# Patient Record
Sex: Male | Born: 1965 | Race: Black or African American | Hispanic: No | Marital: Married | Smoking: Former smoker
Health system: Southern US, Community
[De-identification: ages and names within clinical notes are randomized; demographics above are authoritative.]

## PROBLEM LIST (undated history)

## (undated) DIAGNOSIS — Z95820 Peripheral vascular angioplasty status with implants and grafts: Secondary | ICD-10-CM

## (undated) DIAGNOSIS — I219 Acute myocardial infarction, unspecified: Secondary | ICD-10-CM

## (undated) DIAGNOSIS — I469 Cardiac arrest, cause unspecified: Secondary | ICD-10-CM

## (undated) DIAGNOSIS — K219 Gastro-esophageal reflux disease without esophagitis: Secondary | ICD-10-CM

## (undated) DIAGNOSIS — T7840XA Allergy, unspecified, initial encounter: Secondary | ICD-10-CM

## (undated) DIAGNOSIS — E785 Hyperlipidemia, unspecified: Secondary | ICD-10-CM

## (undated) DIAGNOSIS — I739 Peripheral vascular disease, unspecified: Secondary | ICD-10-CM

## (undated) DIAGNOSIS — G4730 Sleep apnea, unspecified: Secondary | ICD-10-CM

## (undated) DIAGNOSIS — I10 Essential (primary) hypertension: Secondary | ICD-10-CM

## (undated) DIAGNOSIS — R011 Cardiac murmur, unspecified: Secondary | ICD-10-CM

## (undated) DIAGNOSIS — I509 Heart failure, unspecified: Secondary | ICD-10-CM

## (undated) DIAGNOSIS — I2510 Atherosclerotic heart disease of native coronary artery without angina pectoris: Secondary | ICD-10-CM

## (undated) HISTORY — DX: Sleep apnea, unspecified: G47.30

## (undated) HISTORY — PX: FRACTURE SURGERY: SHX138

## (undated) HISTORY — DX: Acute myocardial infarction, unspecified: I21.9

## (undated) HISTORY — PX: COLONOSCOPY: SHX174

## (undated) HISTORY — DX: Hyperlipidemia, unspecified: E78.5

## (undated) HISTORY — DX: Essential (primary) hypertension: I10

## (undated) HISTORY — PX: WISDOM TOOTH EXTRACTION: SHX21

## (undated) HISTORY — PX: CARDIAC SURGERY: SHX584

## (undated) HISTORY — DX: Heart failure, unspecified: I50.9

## (undated) HISTORY — DX: Cardiac murmur, unspecified: R01.1

## (undated) HISTORY — DX: Peripheral vascular disease, unspecified: I73.9

## (undated) HISTORY — PX: CORONARY STENT PLACEMENT: SHX1402

## (undated) HISTORY — DX: Allergy, unspecified, initial encounter: T78.40XA

## (undated) HISTORY — DX: Gastro-esophageal reflux disease without esophagitis: K21.9

## (undated) HISTORY — PX: POLYPECTOMY: SHX149

---

## 2012-04-11 ENCOUNTER — Encounter: Payer: Self-pay | Admitting: Gastroenterology

## 2012-12-04 DIAGNOSIS — I219 Acute myocardial infarction, unspecified: Secondary | ICD-10-CM

## 2012-12-04 HISTORY — DX: Acute myocardial infarction, unspecified: I21.9

## 2013-03-29 ENCOUNTER — Emergency Department (HOSPITAL_COMMUNITY): Payer: BC Managed Care – PPO

## 2013-03-29 ENCOUNTER — Inpatient Hospital Stay (HOSPITAL_COMMUNITY)
Admission: EM | Admit: 2013-03-29 | Discharge: 2013-04-02 | DRG: 550 | Disposition: A | Discharge status: Home / Self Care | Payer: BC Managed Care – PPO | Attending: Cardiovascular Disease | Admitting: Cardiovascular Disease

## 2013-03-29 ENCOUNTER — Ambulatory Visit (HOSPITAL_COMMUNITY): Admit: 2013-03-29 | Payer: Self-pay | Admitting: Cardiovascular Disease

## 2013-03-29 ENCOUNTER — Encounter (HOSPITAL_COMMUNITY)
Admission: EM | Disposition: A | Discharge status: Home / Self Care | Payer: Self-pay | Source: Home / Self Care | Attending: Cardiovascular Disease

## 2013-03-29 DIAGNOSIS — Z95820 Peripheral vascular angioplasty status with implants and grafts: Secondary | ICD-10-CM

## 2013-03-29 DIAGNOSIS — R079 Chest pain, unspecified: Secondary | ICD-10-CM

## 2013-03-29 DIAGNOSIS — I472 Ventricular tachycardia, unspecified: Secondary | ICD-10-CM | POA: Diagnosis present

## 2013-03-29 DIAGNOSIS — Z955 Presence of coronary angioplasty implant and graft: Secondary | ICD-10-CM

## 2013-03-29 DIAGNOSIS — Z87891 Personal history of nicotine dependence: Secondary | ICD-10-CM

## 2013-03-29 DIAGNOSIS — R404 Transient alteration of awareness: Secondary | ICD-10-CM | POA: Diagnosis present

## 2013-03-29 DIAGNOSIS — I2582 Chronic total occlusion of coronary artery: Secondary | ICD-10-CM | POA: Diagnosis present

## 2013-03-29 DIAGNOSIS — I213 ST elevation (STEMI) myocardial infarction of unspecified site: Secondary | ICD-10-CM

## 2013-03-29 DIAGNOSIS — I2510 Atherosclerotic heart disease of native coronary artery without angina pectoris: Secondary | ICD-10-CM | POA: Diagnosis present

## 2013-03-29 DIAGNOSIS — I4949 Other premature depolarization: Secondary | ICD-10-CM | POA: Diagnosis present

## 2013-03-29 DIAGNOSIS — I209 Angina pectoris, unspecified: Secondary | ICD-10-CM | POA: Diagnosis present

## 2013-03-29 DIAGNOSIS — I2119 ST elevation (STEMI) myocardial infarction involving other coronary artery of inferior wall: Principal | ICD-10-CM | POA: Diagnosis present

## 2013-03-29 DIAGNOSIS — E876 Hypokalemia: Secondary | ICD-10-CM | POA: Diagnosis present

## 2013-03-29 DIAGNOSIS — I4729 Other ventricular tachycardia: Secondary | ICD-10-CM | POA: Diagnosis present

## 2013-03-29 DIAGNOSIS — I469 Cardiac arrest, cause unspecified: Secondary | ICD-10-CM

## 2013-03-29 HISTORY — PX: LEFT HEART CATHETERIZATION WITH CORONARY ANGIOGRAM: SHX5451

## 2013-03-29 HISTORY — DX: Atherosclerotic heart disease of native coronary artery without angina pectoris: I25.10

## 2013-03-29 HISTORY — DX: Cardiac arrest, cause unspecified: I46.9

## 2013-03-29 HISTORY — DX: Peripheral vascular angioplasty status with implants and grafts: Z95.820

## 2013-03-29 LAB — CBC
Hemoglobin: 16.3 g/dL (ref 13.0–17.0)
Platelets: 156 10*3/uL (ref 150–400)
RBC: 5.24 MIL/uL (ref 4.22–5.81)
WBC: 16.4 10*3/uL — ABNORMAL HIGH (ref 4.0–10.5)

## 2013-03-29 LAB — POCT I-STAT, CHEM 8
BUN: 14 mg/dL (ref 6–23)
Calcium, Ion: 1.09 mmol/L — ABNORMAL LOW (ref 1.12–1.23)
Creatinine, Ser: 1.2 mg/dL (ref 0.50–1.35)
Glucose, Bld: 149 mg/dL — ABNORMAL HIGH (ref 70–99)
Sodium: 140 mEq/L (ref 135–145)
TCO2: 21 mmol/L (ref 0–100)

## 2013-03-29 LAB — COMPREHENSIVE METABOLIC PANEL
ALT: 46 U/L (ref 0–53)
AST: 48 U/L — ABNORMAL HIGH (ref 0–37)
Alkaline Phosphatase: 50 U/L (ref 39–117)
CO2: 19 mEq/L (ref 19–32)
Chloride: 102 mEq/L (ref 96–112)
GFR calc Af Amer: 86 mL/min — ABNORMAL LOW (ref >90)
GFR calc non Af Amer: 75 mL/min — ABNORMAL LOW (ref >90)
Glucose, Bld: 149 mg/dL — ABNORMAL HIGH (ref 70–99)
Sodium: 140 mEq/L (ref 135–145)
Total Bilirubin: 0.4 mg/dL (ref 0.3–1.2)

## 2013-03-29 SURGERY — LEFT HEART CATHETERIZATION WITH CORONARY ANGIOGRAM
Anesthesia: LOCAL

## 2013-03-29 MED ORDER — BIVALIRUDIN 250 MG IV SOLR
INTRAVENOUS | Status: AC
  Filled 2013-03-29: qty 250

## 2013-03-29 MED ORDER — POTASSIUM CHLORIDE 10 MEQ/100ML IV SOLN
INTRAVENOUS | Status: AC
  Administered 2013-03-29: 10 meq via INTRAVENOUS
  Filled 2013-03-29: qty 400

## 2013-03-29 MED ORDER — SODIUM CHLORIDE 0.9 % IV SOLN
INTRAVENOUS | Status: DC
  Administered 2013-03-30 (×2): via INTRAVENOUS

## 2013-03-29 MED ORDER — ASPIRIN EC 81 MG PO TBEC
81.0000 mg | DELAYED_RELEASE_TABLET | Freq: Every day | ORAL | Status: DC
  Administered 2013-03-30 – 2013-04-02 (×4): 81 mg via ORAL
  Filled 2013-03-29 (×4): qty 1

## 2013-03-29 MED ORDER — ASPIRIN 81 MG PO CHEW
324.0000 mg | CHEWABLE_TABLET | Freq: Once | ORAL | Status: AC
  Administered 2013-03-29: 324 mg via ORAL

## 2013-03-29 MED ORDER — ATORVASTATIN CALCIUM 80 MG PO TABS
80.0000 mg | ORAL_TABLET | Freq: Every day | ORAL | Status: DC
  Administered 2013-03-30 – 2013-04-01 (×3): 80 mg via ORAL
  Filled 2013-03-29 (×4): qty 1

## 2013-03-29 MED ORDER — TICAGRELOR 90 MG PO TABS
ORAL_TABLET | ORAL | Status: AC
  Filled 2013-03-29: qty 2

## 2013-03-29 MED ORDER — ONDANSETRON HCL 4 MG/2ML IJ SOLN
4.0000 mg | Freq: Four times a day (QID) | INTRAMUSCULAR | Status: DC | PRN
  Administered 2013-03-30 (×2): 4 mg via INTRAVENOUS
  Filled 2013-03-29 (×2): qty 2

## 2013-03-29 MED ORDER — FENTANYL CITRATE 0.05 MG/ML IJ SOLN
INTRAMUSCULAR | Status: AC
  Filled 2013-03-29: qty 2

## 2013-03-29 MED ORDER — HEPARIN BOLUS VIA INFUSION: 4000.0000 [IU] | Freq: Once | INTRAVENOUS | Status: DC

## 2013-03-29 MED ORDER — NITROGLYCERIN IN D5W 200-5 MCG/ML-% IV SOLN
INTRAVENOUS | Status: AC
  Administered 2013-03-30: 10 ug/min via INTRAVENOUS
  Filled 2013-03-29: qty 250

## 2013-03-29 MED ORDER — NITROGLYCERIN IN D5W 200-5 MCG/ML-% IV SOLN: 2.0000 ug/min | INTRAVENOUS | Status: DC

## 2013-03-29 MED ORDER — HEPARIN SODIUM (PORCINE) 5000 UNIT/ML IJ SOLN
5000.0000 [IU] | Freq: Once | INTRAMUSCULAR | Status: AC
  Administered 2013-03-29: 5000 [IU] via INTRAVENOUS

## 2013-03-29 MED ORDER — ACETAMINOPHEN 325 MG PO TABS
650.0000 mg | ORAL_TABLET | ORAL | Status: DC | PRN
  Administered 2013-03-30: 650 mg via ORAL
  Filled 2013-03-29: qty 2

## 2013-03-29 MED ORDER — HEPARIN (PORCINE) IN NACL 2-0.9 UNIT/ML-% IJ SOLN
INTRAMUSCULAR | Status: AC
  Filled 2013-03-29: qty 1000

## 2013-03-29 MED ORDER — HEPARIN (PORCINE) IN NACL 100-0.45 UNIT/ML-% IJ SOLN
1250.0000 [IU]/h | INTRAMUSCULAR | Status: DC
  Administered 2013-03-29: 1250 [IU]/h via INTRAVENOUS
  Filled 2013-03-29: qty 250

## 2013-03-29 MED ORDER — POTASSIUM CHLORIDE 10 MEQ/100ML IV SOLN
10.0000 meq | INTRAVENOUS | Status: AC
  Administered 2013-03-30 (×3): 10 meq via INTRAVENOUS

## 2013-03-29 MED ORDER — AMIODARONE HCL IN DEXTROSE 360-4.14 MG/200ML-% IV SOLN
INTRAVENOUS | Status: AC
  Administered 2013-03-29: 200 mL via INTRAVENOUS
  Filled 2013-03-29: qty 200

## 2013-03-29 MED ORDER — LIDOCAINE HCL (PF) 1 % IJ SOLN
INTRAMUSCULAR | Status: AC
  Filled 2013-03-29: qty 30

## 2013-03-29 MED ORDER — AMIODARONE IV BOLUS ONLY 150 MG/100ML: 150.0000 mg | Freq: Once | INTRAVENOUS | Status: DC

## 2013-03-29 MED ORDER — MIDAZOLAM HCL 2 MG/2ML IJ SOLN
INTRAMUSCULAR | Status: AC
  Filled 2013-03-29: qty 2

## 2013-03-29 MED ORDER — TICAGRELOR 90 MG PO TABS
90.0000 mg | ORAL_TABLET | Freq: Two times a day (BID) | ORAL | Status: DC
  Administered 2013-03-30 – 2013-04-02 (×7): 90 mg via ORAL
  Filled 2013-03-29 (×8): qty 1

## 2013-03-29 MED ORDER — HEPARIN (PORCINE) IN NACL 100-0.45 UNIT/ML-% IJ SOLN: 12.0000 [IU]/kg/h | Freq: Once | INTRAMUSCULAR | Status: DC

## 2013-03-29 MED ORDER — NITROGLYCERIN 1 MG/10 ML FOR IR/CATH LAB
INTRA_ARTERIAL | Status: AC
  Filled 2013-03-29: qty 10

## 2013-03-29 MED ORDER — SODIUM CHLORIDE 0.9 % IV BOLUS (SEPSIS)
500.0000 mL | Freq: Once | INTRAVENOUS | Status: AC
  Administered 2013-03-29: 500 mL via INTRAVENOUS

## 2013-03-29 NOTE — ED Notes (Signed)
Patient undressed, patient on Zoll pads for cath lab.

## 2013-03-29 NOTE — H&P (Signed)
HPI: The patient is a 47 y/o AAM with no significant past medical history and no known cardiac risk factors, who presented to [facility] with an inferior STEMI. The patient reportedly developed substernal chest pain earlier this evening that lasted 1.5-2 hrs before calling EMS. When EMS arrived to his home, an EKG was obtained that showed ST elevations in the inferior leads. Code STEMI was activated. Enroute, the patient went into Torsades and required a total of 4 shocks for resuscitation. Post resuscitation, he was bradycardic. Temporary pacer pads were placed. The patient is now alert and responsive. He continues to endorse chest pain that has now radiated to the right side. The pain is currently 7/10. He denies any other symptoms.   No past medical history on file.  No past surgical history on file.  No family history on file. Social History:  has no tobacco, alcohol, and drug history on file.  Allergies: No Known Allergies   (Not in a hospital admission)  Results for orders placed during the hospital encounter of 03/29/13 (from the past 48 hour(s))  POCT I-STAT TROPONIN I     Status: None   Collection Time    03/29/13  9:28 PM      Result Value Range   Troponin i, poc 0.02  0.00 - 0.08 ng/mL   Comment 3            Comment: Due to the release kinetics of cTnI,     a negative result within the first hours     of the onset of symptoms does not rule out     myocardial infarction with certainty.     If myocardial infarction is still suspected,     repeat the test at appropriate intervals.  POCT I-STAT, CHEM 8     Status: Abnormal   Collection Time    03/29/13  9:29 PM      Result Value Range   Sodium 140  135 - 145 mEq/L   Potassium 3.4 (*) 3.5 - 5.1 mEq/L   Chloride 106  96 - 112 mEq/L   BUN 14  6 - 23 mg/dL   Creatinine, Ser 4.78  0.50 - 1.35 mg/dL   Glucose, Bld 295 (*) 70 - 99 mg/dL   Calcium, Ion 6.21 (*) 1.12 - 1.23 mmol/L   TCO2 21  0 - 100 mmol/L   Hemoglobin 17.0  13.0 -  17.0 g/dL   HCT 30.8  65.7 - 84.6 %   No results found.  Review of Systems  Respiratory: Negative for shortness of breath.   Cardiovascular: Positive for chest pain.  Neurological: Positive for loss of consciousness.    Blood pressure 152/102, pulse 83, temperature 97.6 F (36.4 C), temperature source Oral, resp. rate 27, height 5\' 10"  (1.778 m), weight 220 lb (99.791 kg), SpO2 100.00%. Physical Exam  Constitutional: He is oriented to person, place, and time. He appears well-developed and well-nourished. No distress.  HENT:  Head: Atraumatic.  Eyes: Conjunctivae are normal. Pupils are equal, round, and reactive to light.  Cardiovascular: Normal rate and regular rhythm.  Exam reveals friction rub. Exam reveals no gallop.   No murmur heard. Pulses:      Radial pulses are 2+ on the right side, and 2+ on the left side.       Dorsalis pedis pulses are 1+ on the right side, and 1+ on the left side.  Respiratory: Effort normal and breath sounds normal. No respiratory distress. He has no wheezes. He  has no rales.  Musculoskeletal: He exhibits no edema.  Neurological: He is alert and oriented to person, place, and time.  Skin: Skin is warm and dry. He is not diaphoretic.  Psychiatric: He has a normal mood and affect. His behavior is normal.     Assessment/Plan Principal Problem:   STEMI (ST elevation myocardial infarction)  Plan: Code STEMI activated. Pt taken urgently to cath lab for PCI w/ Dr. Tresa Endo.   Allayne Butcher, PA-C 03/29/2013, 9:46 PM   Patient seen and examined. Agree with assessment and plan. 47 yo AAm without prior known cardiac history who presents to ER with inferior STEMI. Of note prior to arrival, pt developed probable ischemia mediated torsades du pointes requiring countershock. ECG suggest inferolateral ST elevation with precordial ST depression suggestive of probable circumflex/RCA occlusion. Plan emergent cath.   Lennette Bihari, MD, [facility] 03/29/2013 11:29  PM

## 2013-03-29 NOTE — Progress Notes (Signed)
ANTICOAGULATION CONSULT NOTE - Initial Consult  Pharmacy Consult for Heparin Indication: STEMI  No Known Allergies  Patient Measurements:   Per patient - Height 69", Weight 220 lb. Heparin Dosing Weight: 92 kg  Vital Signs: Temp: 97.6 F (36.4 C) (04/26 2108) Temp src: Oral (04/26 2108) BP: 149/99 mmHg (04/26 2108) Pulse Rate: 85 (04/26 2108)  Labs: No results found for this basename: HGB, HCT, PLT, APTT, LABPROT, INR, HEPARINUNFRC, CREATININE, CKTOTAL, CKMB, TROPONINI,  in the last 72 hours  CrCl is unknown because no creatinine reading has been taken and the patient has no height on file.   Medical History: No past medical history on file.  Medications:  Infusions:  . [COMPLETED] amiodarone (NEXTERONE PREMIX) 360 mg/200 mL dextrose 200 mL (03/29/13 2115)  . [COMPLETED] sodium chloride 500 mL (03/29/13 2114)    Assessment: 47 year old male with STEMI and s/p Vfib arrest to receive anticoagulation with Heparin.  He has already received a 5000 units bolus in the ED.  Goal of Therapy:  Heparin level 0.3-0.7 units/ml Monitor platelets by anticoagulation protocol: Yes   Plan:  Start Heparin drip at 1250 units/hr Follow-up post-cath for anticoagulation needs  Estella Husk, Pharm.D., BCPS Clinical Pharmacist Phone: [phone] or [phone] Pager: [phone] 03/29/2013, 9:25 PM

## 2013-03-29 NOTE — ED Provider Notes (Signed)
History     CSN: [id number]  Arrival date & time 03/29/13  2100   First MD Initiated Contact with Patient 03/29/13 2113      Chief Complaint  Patient presents with  . Code STEMI    (Consider location/radiation/quality/duration/timing/severity/associated sxs/prior treatment) HPI Comments: 56 y M with no known PMH here with inferior/posterior STEMI.  Denies medication use, tobacco use and illicit drug use.  CP started approx 1.5 hours PTA.  Pt was cardioverted x4 by EMS for torsades.  He was also paced PTA for bradycardia and had 150 mg Amio running on arrival.  HDS, with moderate chest discomfort on arrival.   Patient is a 47 y.o. male presenting with chest pain. The history is provided by the patient and the EMS personnel.  Chest Pain Pain location:  Substernal area Pain quality: aching   Pain severity:  Moderate Onset quality:  Sudden Timing:  Constant Progression since onset: improved. Chronicity:  New Context: no drug use     History reviewed. No pertinent past medical history.  Past Surgical History  Procedure Laterality Date  . Fracture surgery      both ankles as a child    History reviewed. No pertinent family history.  History  Substance Use Topics  . Smoking status: Former Smoker -- 23 years    Types: Cigarettes    Quit date: 03/04/2012  . Smokeless tobacco: Not on file  . Alcohol Use: 3.0 oz/week    5 Cans of beer per week      Review of Systems  Unable to perform ROS: Acuity of condition  Cardiovascular: Positive for chest pain.    Allergies  Review of patient's allergies indicates no known allergies.  Home Medications  No current outpatient prescriptions on file.  BP 149/99  Pulse 85  Temp(Src) 97.6 F (36.4 C) (Oral)  Resp 25  SpO2 98%  Physical Exam  Vitals reviewed. Constitutional: He is oriented to person, place, and time. He appears well-developed and well-nourished. He appears distressed.  HENT:  Head: Normocephalic.  Right  Ear: External ear normal.  Left Ear: External ear normal.  Nose: Nose normal.  Mouth/Throat: Oropharynx is clear and moist. No oropharyngeal exudate.  Eyes: Conjunctivae and EOM are normal. Pupils are equal, round, and reactive to light.  Neck: Normal range of motion. Neck supple.  Cardiovascular: Normal rate, regular rhythm, normal heart sounds and intact distal pulses.  Exam reveals no gallop and no friction rub.   No murmur heard. Pulmonary/Chest: Effort normal.  BS decreased in bases bilaterally  Abdominal: Soft. Bowel sounds are normal. He exhibits no distension. There is no tenderness.  Musculoskeletal: Normal range of motion. He exhibits no edema and no tenderness.  Neurological: He is alert and oriented to person, place, and time.  Skin: Skin is warm and dry.  Psychiatric: He has a normal mood and affect.    ED Course  Procedures (including critical care time)  Labs Reviewed  CBC - Abnormal; Notable for the following:    WBC 16.4 (*)    All other components within normal limits  COMPREHENSIVE METABOLIC PANEL - Abnormal; Notable for the following:    Glucose, Bld 149 (*)    AST 48 (*)    GFR calc non Af Amer 75 (*)    GFR calc Af Amer 86 (*)    All other components within normal limits  POCT I-STAT, CHEM 8 - Abnormal; Notable for the following:    Potassium 3.4 (*)  Glucose, Bld 149 (*)    Calcium, Ion 1.09 (*)    All other components within normal limits  MRSA PCR SCREENING  PROTIME-INR  MAGNESIUM  CBC  BASIC METABOLIC PANEL  POCT I-STAT TROPONIN I   Dg Chest Port 1 View  03/29/2013  *RADIOLOGY REPORT*  Clinical Data: Code ST elevation myocardial infarction, cough  PORTABLE CHEST - 1 VIEW  Comparison: None.  Findings: Heart size is upper limits of normal.  Central vascular congestion noted with hazy perihilar airspace opacities but no focal opacity otherwise.  No pleural effusion. Fine detail is obscured by patient body habitus.  Mild right AC joint degenerative  change.  No acute osseous abnormality.  IMPRESSION: Mild cardiomegaly with central vascular congestion and hazy perihilar airspace opacities which could in part be due to overlying soft tissue structures, although early edema could have a similar appearance.   Original Report Authenticated By: Christiana Pellant, M.D.      1. STEMI (ST elevation myocardial infarction)   2. Chest pain       MDM   35 y M with no known PMH here with inferior/posterior STEMI.  Denies medication use, tobacco use and illicit drug use.  CP started approx 1.5 hours PTA.  Pt was cardioverted x4 by EMS for torsades.  He was also paced PTA for bradycardia and had 150 mg Amio running on arrival.  HDS, with moderate chest discomfort on arrival.  Intrinsic rate in 80s so pacing d/c'd.  Lungs decreased in the bases.  No edema.  EMS EKG c/w posterior/inferior STEMI with large STD in anterior leads.  5k heparin and 324 mg ASA given on arrival.  Amio and heparin gtt's ordered and NS bolus administered given inferior STEMI.  Nitro held given inferior/posterior involvement.  STEMI labs sent.  CXR obtained while waiting for cath lab.  Pt then taken immediately for catheterization.  Disposition: Cath Lab  Condition: Serious, Stable  Pt seen in conjunction with my attending, Dr. Blinda Leatherwood.  Oleh Genin, MD PGY-II [facility] Emergency Medicine Resident   Oleh Genin, MD 03/30/13 [phone]

## 2013-03-29 NOTE — ED Notes (Signed)
Patient with chest pain coming via EMS, went into VFib, shocked x4, was given 150mg  of Amiodorone, took advil prior to EMS arrival. Patient denies pain at this time, Code stemi Called upon arrival to Ed.

## 2013-03-29 NOTE — ED Notes (Signed)
To Cath lab 

## 2013-03-30 ENCOUNTER — Encounter (HOSPITAL_COMMUNITY): Payer: Self-pay | Admitting: *Deleted

## 2013-03-30 LAB — BASIC METABOLIC PANEL
BUN: 11 mg/dL (ref 6–23)
CO2: 22 mEq/L (ref 19–32)
Calcium: 8.2 mg/dL — ABNORMAL LOW (ref 8.4–10.5)
Creatinine, Ser: 0.89 mg/dL (ref 0.50–1.35)
Glucose, Bld: 133 mg/dL — ABNORMAL HIGH (ref 70–99)

## 2013-03-30 LAB — MAGNESIUM: Magnesium: 1.7 mg/dL (ref 1.5–2.5)

## 2013-03-30 LAB — CBC
Hemoglobin: 14.4 g/dL (ref 13.0–17.0)
MCH: 30.2 pg (ref 26.0–34.0)
MCV: 84.3 fL (ref 78.0–100.0)
RBC: 4.77 MIL/uL (ref 4.22–5.81)

## 2013-03-30 MED ORDER — AMIODARONE HCL IN DEXTROSE 360-4.14 MG/200ML-% IV SOLN
INTRAVENOUS | Status: AC
  Administered 2013-03-30: 200 mL via INTRAVENOUS
  Filled 2013-03-30: qty 200

## 2013-03-30 MED ORDER — AMIODARONE HCL IN DEXTROSE 360-4.14 MG/200ML-% IV SOLN: 60.0000 mg/h | INTRAVENOUS | Status: DC

## 2013-03-30 MED ORDER — PROMETHAZINE HCL 12.5 MG PO TABS
6.2600 mg | ORAL_TABLET | Freq: Four times a day (QID) | ORAL | Status: DC | PRN
Start: 2013-03-30 — End: 2013-04-01
  Administered 2013-03-30: 6.26 mg via ORAL
  Filled 2013-03-30 (×2): qty 1

## 2013-03-30 MED ORDER — LISINOPRIL 5 MG PO TABS
5.0000 mg | ORAL_TABLET | Freq: Every day | ORAL | Status: DC
  Administered 2013-03-30 – 2013-04-01 (×3): 5 mg via ORAL
  Filled 2013-03-30 (×3): qty 1

## 2013-03-30 MED ORDER — AMIODARONE HCL IN DEXTROSE 360-4.14 MG/200ML-% IV SOLN
60.0000 mg/h | INTRAVENOUS | Status: DC
  Administered 2013-03-30 – 2013-03-31 (×4): 60 mg/h via INTRAVENOUS
  Filled 2013-03-30 (×8): qty 200

## 2013-03-30 MED ORDER — AMIODARONE HCL IN DEXTROSE 360-4.14 MG/200ML-% IV SOLN
INTRAVENOUS | Status: AC
  Administered 2013-03-30: 60 mg/h via INTRAVENOUS
  Filled 2013-03-30: qty 200

## 2013-03-30 MED ORDER — AMIODARONE LOAD VIA INFUSION
150.0000 mg | Freq: Once | INTRAVENOUS | Status: AC
  Administered 2013-03-30: 150 mg via INTRAVENOUS

## 2013-03-30 MED ORDER — MAGNESIUM OXIDE 400 (241.3 MG) MG PO TABS
400.0000 mg | ORAL_TABLET | Freq: Two times a day (BID) | ORAL | Status: DC
  Administered 2013-03-30 – 2013-04-02 (×8): 400 mg via ORAL
  Filled 2013-03-30 (×9): qty 1

## 2013-03-30 MED ORDER — ONDANSETRON 8 MG/NS 50 ML IVPB
8.0000 mg | Freq: Four times a day (QID) | INTRAVENOUS | Status: DC | PRN
  Administered 2013-03-30: 8 mg via INTRAVENOUS
  Filled 2013-03-30 (×2): qty 8

## 2013-03-30 MED ORDER — SODIUM CHLORIDE 0.9 % IV SOLN
INTRAVENOUS | Status: DC | PRN
  Administered 2013-03-30 (×2): via INTRAVENOUS

## 2013-03-30 MED ORDER — MORPHINE SULFATE 2 MG/ML IJ SOLN
1.0000 mg | INTRAMUSCULAR | Status: DC | PRN
  Administered 2013-03-30: 1 mg via INTRAVENOUS
  Filled 2013-03-30: qty 1

## 2013-03-30 MED ORDER — METOPROLOL TARTRATE 25 MG PO TABS
25.0000 mg | ORAL_TABLET | Freq: Two times a day (BID) | ORAL | Status: DC
  Administered 2013-03-30: 25 mg via ORAL
  Filled 2013-03-30 (×3): qty 1

## 2013-03-30 MED ORDER — BIOTENE DRY MOUTH MT LIQD
15.0000 mL | Freq: Two times a day (BID) | OROMUCOSAL | Status: DC
  Administered 2013-03-30 – 2013-04-01 (×4): 15 mL via OROMUCOSAL

## 2013-03-30 MED ORDER — ZOLPIDEM TARTRATE 5 MG PO TABS: 5.0000 mg | ORAL_TABLET | Freq: Every evening | ORAL | Status: DC | PRN

## 2013-03-30 MED ORDER — ALPRAZOLAM 0.25 MG PO TABS
0.2500 mg | ORAL_TABLET | Freq: Three times a day (TID) | ORAL | Status: DC | PRN
  Administered 2013-03-30: 0.25 mg via ORAL
  Filled 2013-03-30: qty 1

## 2013-03-30 MED ORDER — METOPROLOL TARTRATE 25 MG PO TABS
25.0000 mg | ORAL_TABLET | Freq: Four times a day (QID) | ORAL | Status: DC
  Administered 2013-03-30 – 2013-03-31 (×5): 25 mg via ORAL
  Filled 2013-03-30 (×6): qty 1

## 2013-03-30 MED ORDER — ATROPINE SULFATE 1 MG/ML IJ SOLN
INTRAMUSCULAR | Status: AC
  Filled 2013-03-30: qty 1

## 2013-03-30 MED ORDER — SODIUM CHLORIDE 0.9 % IV SOLN
0.2500 mg/kg/h | INTRAVENOUS | Status: DC
  Filled 2013-03-30: qty 250

## 2013-03-30 MED ORDER — TRAMADOL HCL 50 MG PO TABS
50.0000 mg | ORAL_TABLET | Freq: Four times a day (QID) | ORAL | Status: DC | PRN
  Filled 2013-03-30: qty 1

## 2013-03-30 MED ORDER — AMIODARONE HCL IN DEXTROSE 360-4.14 MG/200ML-% IV SOLN
60.0000 mg/h | INTRAVENOUS | Status: AC
  Administered 2013-03-30 (×2): 60 mg/h via INTRAVENOUS
  Filled 2013-03-30: qty 200

## 2013-03-30 MED ORDER — HEPARIN (PORCINE) IN NACL 100-0.45 UNIT/ML-% IJ SOLN
1450.0000 [IU]/h | INTRAMUSCULAR | Status: DC
  Administered 2013-03-30: 1250 [IU]/h via INTRAVENOUS
  Administered 2013-03-31: 1450 [IU]/h via INTRAVENOUS
  Filled 2013-03-30 (×4): qty 250

## 2013-03-30 NOTE — Progress Notes (Signed)
CRITICAL VALUE ALERT  Critical value received:  Trop >20.00  Date of notification: 03-30-13  Time of notification:  1200  Critical value read back:yes  Nurse who received alert:  Doy Mince, RN  MD notified (1st page):  [place] NP  Time of first page:    MD notified (2nd page):  Time of second page:  Responding MD:  Lowanda Foster NP  Time MD responded:

## 2013-03-30 NOTE — Progress Notes (Signed)
Right femoral arterial sheath removed per protocol at 0515.  VS remained stable throughout; no bradycardia noted.  Manual pressure held for 15 minutes until hemostasis achieved.  Site observed for an additional 5 minutes with no bleeding noted; no hematoma palpated.  Sterile dressing applied to site.  Patient tolerated procedure well. Instructed on post sheath removal care and verbalized understanding of information provided.

## 2013-03-30 NOTE — Progress Notes (Signed)
ANTICOAGULATION CONSULT NOTE - Follow Up Consult  Pharmacy Consult for Heparin Indication: chest pain/ACS, s/p PCI with high grade RCA stenosis  No Known Allergies  Patient Measurements: Height: 5\' 10"  (177.8 cm) Weight: 220 lb (99.791 kg) IBW/kg (Calculated) : 73 Heparin Dosing Weight: 94 kg  Vital Signs: Temp: 99.1 F (37.3 C) (04/27 0400) Temp src: Oral (04/27 0400) BP: 131/78 mmHg (04/27 0730) Pulse Rate: 76 (04/27 0730)  Labs:  Recent Labs  03/29/13 2125 03/29/13 2129 03/29/13 2130 03/30/13 0505  HGB 16.3 17.0  --  14.4  HCT 45.5 50.0  --  40.2  PLT 156  --   --  170  LABPROT  --   --  13.3  --   INR  --   --  1.02  --   CREATININE 1.16 1.20  --  0.89    Estimated Creatinine Clearance: 124.1 ml/min (by C-G formula based on Cr of 0.89).   Medications:  Scheduled:  . amiodarone (NEXTERONE PREMIX) 360 mg/200 mL dextrose      . [COMPLETED] amiodarone  150 mg Intravenous Once  . [COMPLETED] amiodarone  150 mg Intravenous Once  . amiodarone  150 mg Intravenous Once  . antiseptic oral rinse  15 mL Mouth Rinse BID  . [COMPLETED] aspirin  324 mg Oral Once  . aspirin EC  81 mg Oral Daily  . atorvastatin  80 mg Oral q1800  . [COMPLETED] bivalirudin      . [COMPLETED] bivalirudin      . [COMPLETED] fentaNYL      . [COMPLETED] heparin      . [COMPLETED] heparin  5,000 Units Intravenous Once  . [COMPLETED] lidocaine (PF)      . lisinopril  5 mg Oral Daily  . magnesium oxide  400 mg Oral BID  . metoprolol tartrate  25 mg Oral QID  . [COMPLETED] midazolam      . [COMPLETED] nitroGLYCERIN      . [COMPLETED] potassium chloride  10 mEq Intravenous Q1 Hr x 4  . [COMPLETED] sodium chloride  500 mL Intravenous Once  . [COMPLETED] Ticagrelor      . Ticagrelor  90 mg Oral BID  . [DISCONTINUED] heparin  12 Units/kg/hr (Order-Specific) Intravenous Once  . [DISCONTINUED] heparin  4,000 Units Intravenous Once  . [DISCONTINUED] metoprolol tartrate  25 mg Oral BID     Assessment: 47 y/o M who presented with STEMI now s/p PCI to circumflex who will re-start heparin with no bolus 6 hours after sheath pull (0981) for high grade RCA stenosis per Dr. Tresa Endo. CBC stable, Renal function stable, Baseline INR 1.02.   Goal of Therapy:  Heparin level 0.3-0.7 units/ml Monitor platelets by anticoagulation protocol: Yes   Plan:  -Start heparin infusion at 1250 units/hr at 1115 -6 hour heparin level at 1800 -Daily CBC/HL -Monitor for bleeding from cath site  Abran Duke, PharmD Clinical Pharmacist Phone: [phone] Pager: [phone] 03/30/2013 9:07 AM

## 2013-03-30 NOTE — Progress Notes (Signed)
Notified NP of pt vomiting episodes today.  Orders received and carried out.

## 2013-03-30 NOTE — Progress Notes (Signed)
[facility]  Subjective: Pt had 2 episodes of mild chest discomfort, described as sharp pains, unlike his prior CP. The discomfort was relieved with upward titration of IV Nitro. Discomfort is down to 1/10. No SOB. Now has headache. He occasionally will feel palpitations when he goes into NSVT. Hasn't been lightheaded or dizzy. He denies rt groin pain or bleeding.   Objective: Vital signs in last 24 hours: Temp:  [97.6 F (36.4 C)-99.1 F (37.3 C)] 99.1 F (37.3 C) (04/27 0400) Pulse Rate:  [78-94] 79 (04/27 0630) Resp:  [14-34] 24 (04/27 0630) BP: (116-163)/(69-109) 124/76 mmHg (04/27 0630) SpO2:  [89 %-100 %] 98 % (04/27 0630) Arterial Line BP: (88-109)/(63-77) 109/77 mmHg (04/27 0500) Weight:  [220 lb (99.791 kg)] 220 lb (99.791 kg) (04/26 2128) Last BM Date: 03/29/13  Intake/Output from previous day: 04/26 0701 - 04/27 0700 In: 1992 [P.O.:240; I.V.:1352; IV Piggyback:400] Out: 975 [Urine:975] Intake/Output this shift:    Medications Current Facility-Administered Medications  Medication Dose Route Frequency Provider Last Rate Last Dose  . 0.9 %  sodium chloride infusion   Intravenous Continuous Lennette Bihari, MD 150 mL/hr at 03/30/13 0600    . 0.9 %  sodium chloride infusion   Intravenous Continuous PRN Lennette Bihari, MD 10 mL/hr at 03/30/13 0000    . acetaminophen (TYLENOL) tablet 650 mg  650 mg Oral Q4H PRN Lennette Bihari, MD      . amiodarone (NEXTERONE) IV bolus only 150 mg/100 mL  150 mg Intravenous Once Brittainy Simmons, PA-C      . antiseptic oral rinse (BIOTENE) solution 15 mL  15 mL Mouth Rinse BID Lennette Bihari, MD      . aspirin EC tablet 81 mg  81 mg Oral Daily Lennette Bihari, MD      . atorvastatin (LIPITOR) tablet 80 mg  80 mg Oral q1800 Lennette Bihari, MD      . bivalirudin (ANGIOMAX) 5 mg/mL in sodium chloride 0.9 % 50 mL infusion  0.25 mg/kg/hr Intravenous Continuous Veronda Hollie Salk, RPH      . magnesium oxide (MAG-OX)  tablet 400 mg  400 mg Oral BID Lennette Bihari, MD   400 mg at 03/30/13 0128  . metoprolol tartrate (LOPRESSOR) tablet 25 mg  25 mg Oral BID Lennette Bihari, MD   25 mg at 03/30/13 0057  . nitroGLYCERIN 0.2 mg/mL in dextrose 5 % infusion  2-200 mcg/min Intravenous Continuous Lennette Bihari, MD 6 mL/hr at 03/30/13 0600 20 mcg/min at 03/30/13 0600  . ondansetron (ZOFRAN) injection 4 mg  4 mg Intravenous Q6H PRN Lennette Bihari, MD   4 mg at 03/30/13 0333  . Ticagrelor (BRILINTA) tablet 90 mg  90 mg Oral BID Lennette Bihari, MD        PE: General appearance: alert, cooperative and no distress Lungs: clear to auscultation bilaterally Heart: regular rate and rhythm Extremities: no LEE, the rt groin is stable, non-tender, no ecchymosis, no bruit Pulses: 2+ and symmetric Skin: warm and dry Neurologic: Grossly normal  Lab Results:   Recent Labs  03/29/13 2125 03/29/13 2129 03/30/13 0505  WBC 16.4*  --  12.4*  HGB 16.3 17.0 14.4  HCT 45.5 50.0 40.2  PLT 156  --  170   BMET  Recent Labs  03/29/13 2125 03/29/13 2129 03/30/13 0505  NA 140 140 137  K 3.5 3.4* 4.3  CL 102 106 104  CO2 19  --  22  GLUCOSE 149* 149* 133*  BUN 13 14 11   CREATININE 1.16 1.20 0.89  CALCIUM 9.1  --  8.2*  Cardiac Panel (last 3 results) No results found for this basename: CKTOTAL, CKMB, TROPONINI, RELINDX,  in the last 72 hours PT/INR  Recent Labs  03/29/13 2130  LABPROT 13.3  INR 1.02   Assessment/Plan   Principal Problem:   STEMI (ST elevation myocardial infarction)  Plan: admitted yesterday for inferior STEMI. Prior to arrival to the ED, the patient went into Torsades, necessitating a total of 4 shocks for resuscitation. He is S/P emergent PCI + stenting to circumflex. Post-operatively, he had runs of VT. He was place on IV Amiodarone and started on Lopressor, 25 mg BID. He was hypokalemic with a K+ of 3.4. Mg was 1.7. Both K+ and Mg were supplemented. K+ is WNL today at 4.3. He continues to have   runs of NSVT on telemetry. Continue with amio drip at rate of 60 mg/hr before titration down to 30 . Keep on BB. Replete electrolytes as needed. He had some mild chest discomfort this am that was relieved with up- titration of IV NTG. Rt groin is stable. Will continue to monitor.    LOS: 1 day    Brittainy M. Delmer Islam 03/30/2013 7:18 AM  Patient seen and examined. Agree with assessment and plan. Feels much better. No recurrent chest pain presently. Sinus rhythm with PVC and has had severals short bursts of NSVT. Polymorphic VT yesterday in ambulance prior to arrival to [facility]. Will resume heparin with high grade RCA stenosis. Increase beta-blocker as BP allows to 25 mg q 6 hrs today and continue amiodarone. Will add low dose ACE-I. Will need staged PCI to RCA and re-assess other LCX lesions during this admission.   Lennette Bihari, MD, [facility] 03/30/2013 7:54 AM

## 2013-03-30 NOTE — ED Provider Notes (Signed)
I saw and evaluated the patient, reviewed the resident's note and I agree with the findings and plan.  Patient brought to the ER as a code stabbing. Patient had multiple episodes of V. tach/V. fib requiring 4 shocks by EMS prior to arrival. Patient initiated amiodarone drip and patient was stabilized prior to arrival. EKG reveals inferior ST elevation MI, possible posterior involvement.   Date: 03/30/2013  Rate: 80's  Rhythm: normal sinus rhythm  QRS Axis: normal  Intervals: normal  ST/T Wave abnormalities: ST elevations inferiorly and ST depressions anteriorly  Conduction Disutrbances:none  Narrative Interpretation:   Old EKG Reviewed: none available   patient had been stabilized prior to arrival. Patient brought immediately to the cath lab.    Gilda Crease, MD 03/30/13 [phone]

## 2013-03-30 NOTE — Progress Notes (Signed)
ANTICOAGULATION CONSULT NOTE - Follow Up Consult  Pharmacy Consult for Heparin Indication: chest pain/ACS, s/p PCI with high grade RCA stenosis  No Known Allergies  Patient Measurements: Height: 5\' 10"  (177.8 cm) Weight: 220 lb (99.791 kg) IBW/kg (Calculated) : 73 Heparin Dosing Weight: 94 kg  Vital Signs: Temp: 99.5 F (37.5 C) (04/27 1600) Temp src: Oral (04/27 1600) BP: 134/82 mmHg (04/27 1800) Pulse Rate: 85 (04/27 1800)  Labs:  Recent Labs  03/29/13 2125 03/29/13 2129 03/29/13 2130 03/30/13 0505 03/30/13 0844 03/30/13 1306 03/30/13 1740 03/30/13 1745  HGB 16.3 17.0  --  14.4  --   --   --   --   HCT 45.5 50.0  --  40.2  --   --   --   --   PLT 156  --   --  170  --   --   --   --   LABPROT  --   --  13.3  --   --   --   --   --   INR  --   --  1.02  --   --   --   --   --   HEPARINUNFRC  --   --   --   --   --   --  0.21*  --   CREATININE 1.16 1.20  --  0.89  --   --   --   --   TROPONINI  --   --   --   --  >20.00* >20.00*  --  >20.00*    Estimated Creatinine Clearance: 124.1 ml/min (by C-G formula based on Cr of 0.89).   Medications:  Scheduled:  . [COMPLETED] amiodarone  150 mg Intravenous Once  . [COMPLETED] amiodarone  150 mg Intravenous Once  . amiodarone  150 mg Intravenous Once  . antiseptic oral rinse  15 mL Mouth Rinse BID  . [COMPLETED] aspirin  324 mg Oral Once  . aspirin EC  81 mg Oral Daily  . atorvastatin  80 mg Oral q1800  . [COMPLETED] bivalirudin      . [COMPLETED] bivalirudin      . [COMPLETED] fentaNYL      . [COMPLETED] heparin      . [COMPLETED] heparin  5,000 Units Intravenous Once  . [COMPLETED] lidocaine (PF)      . lisinopril  5 mg Oral Daily  . magnesium oxide  400 mg Oral BID  . metoprolol tartrate  25 mg Oral QID  . [COMPLETED] midazolam      . [COMPLETED] nitroGLYCERIN      . [COMPLETED] potassium chloride  10 mEq Intravenous Q1 Hr x 4  . [COMPLETED] sodium chloride  500 mL Intravenous Once  . [COMPLETED] Ticagrelor       . Ticagrelor  90 mg Oral BID  . [DISCONTINUED] heparin  12 Units/kg/hr (Order-Specific) Intravenous Once  . [DISCONTINUED] heparin  4,000 Units Intravenous Once  . [DISCONTINUED] metoprolol tartrate  25 mg Oral BID    Assessment: 47 y/o M who presented with STEMI now s/p PCI to circumflex who was re-started on heparin with no bolus 6 hours after sheath pull (1610) for high grade RCA stenosis per Dr. Tresa Endo. Heparin level is subtherapeutic at 0.21. No bleeding noted  Goal of Therapy:  Heparin level 0.3-0.7 units/ml Monitor platelets by anticoagulation protocol: Yes   Plan:  1. Increase heparin gtt to 1450 units/hr 2. Check an 8 hour heparin level  Lysle Pearl, PharmD, BCPS Pager #  [phone] 03/30/2013 6:46 PM

## 2013-03-30 NOTE — Progress Notes (Signed)
Utilization review completed.  

## 2013-03-31 ENCOUNTER — Encounter (HOSPITAL_COMMUNITY): Payer: Self-pay | Admitting: Cardiology

## 2013-03-31 ENCOUNTER — Encounter (HOSPITAL_COMMUNITY)
Admission: EM | Disposition: A | Discharge status: Home / Self Care | Payer: Self-pay | Source: Home / Self Care | Attending: Cardiovascular Disease

## 2013-03-31 DIAGNOSIS — I469 Cardiac arrest, cause unspecified: Secondary | ICD-10-CM

## 2013-03-31 DIAGNOSIS — I2510 Atherosclerotic heart disease of native coronary artery without angina pectoris: Secondary | ICD-10-CM | POA: Diagnosis present

## 2013-03-31 DIAGNOSIS — Z955 Presence of coronary angioplasty implant and graft: Secondary | ICD-10-CM

## 2013-03-31 HISTORY — PX: PERCUTANEOUS CORONARY STENT INTERVENTION (PCI-S): SHX5485

## 2013-03-31 HISTORY — DX: Atherosclerotic heart disease of native coronary artery without angina pectoris: I25.10

## 2013-03-31 HISTORY — DX: Cardiac arrest, cause unspecified: I46.9

## 2013-03-31 LAB — POCT ACTIVATED CLOTTING TIME
Activated Clotting Time: 519 seconds
Activated Clotting Time: 350 seconds

## 2013-03-31 LAB — CBC
Hemoglobin: 14.3 g/dL (ref 13.0–17.0)
MCHC: 36 g/dL (ref 30.0–36.0)
Platelets: 147 10*3/uL — ABNORMAL LOW (ref 150–400)
RBC: 4.66 MIL/uL (ref 4.22–5.81)

## 2013-03-31 LAB — BASIC METABOLIC PANEL
GFR calc Af Amer: >90 mL/min (ref >90)
GFR calc non Af Amer: 82 mL/min — ABNORMAL LOW (ref >90)
Potassium: 3.9 mEq/L (ref 3.5–5.1)
Sodium: 139 mEq/L (ref 135–145)

## 2013-03-31 LAB — HEPARIN LEVEL (UNFRACTIONATED): Heparin Unfractionated: 0.39 IU/mL (ref 0.30–0.70)

## 2013-03-31 SURGERY — PERCUTANEOUS CORONARY STENT INTERVENTION (PCI-S)
Anesthesia: Moderate Sedation | Laterality: Bilateral

## 2013-03-31 MED ORDER — NITROGLYCERIN 1 MG/10 ML FOR IR/CATH LAB
INTRA_ARTERIAL | Status: AC
  Filled 2013-03-31: qty 10

## 2013-03-31 MED ORDER — ONDANSETRON HCL 4 MG/2ML IJ SOLN: 4.0000 mg | Freq: Four times a day (QID) | INTRAMUSCULAR | Status: DC | PRN

## 2013-03-31 MED ORDER — ATROPINE SULFATE 1 MG/ML IJ SOLN
INTRAMUSCULAR | Status: AC
  Filled 2013-03-31: qty 1

## 2013-03-31 MED ORDER — BIVALIRUDIN 250 MG IV SOLR
INTRAVENOUS | Status: AC
  Filled 2013-03-31: qty 250

## 2013-03-31 MED ORDER — AMIODARONE HCL 200 MG PO TABS
400.0000 mg | ORAL_TABLET | Freq: Two times a day (BID) | ORAL | Status: DC
  Administered 2013-03-31 – 2013-04-02 (×5): 400 mg via ORAL
  Filled 2013-03-31 (×6): qty 2

## 2013-03-31 MED ORDER — AMIODARONE IV BOLUS ONLY 150 MG/100ML: 150.0000 mg | Freq: Once | INTRAVENOUS | Status: DC

## 2013-03-31 MED ORDER — LIDOCAINE HCL (PF) 1 % IJ SOLN
INTRAMUSCULAR | Status: AC
  Filled 2013-03-31: qty 30

## 2013-03-31 MED ORDER — SODIUM CHLORIDE 0.9 % IV SOLN: 1.0000 mL/kg/h | INTRAVENOUS | Status: DC

## 2013-03-31 MED ORDER — HEPARIN (PORCINE) IN NACL 2-0.9 UNIT/ML-% IJ SOLN
INTRAMUSCULAR | Status: AC
  Filled 2013-03-31: qty 1000

## 2013-03-31 MED ORDER — MIDAZOLAM HCL 2 MG/2ML IJ SOLN
INTRAMUSCULAR | Status: AC
  Filled 2013-03-31: qty 2

## 2013-03-31 MED ORDER — SODIUM CHLORIDE 0.9 % IJ SOLN: 3.0000 mL | Freq: Two times a day (BID) | INTRAMUSCULAR | Status: DC

## 2013-03-31 MED ORDER — ASPIRIN 81 MG PO CHEW
243.0000 mg | CHEWABLE_TABLET | ORAL | Status: AC
  Administered 2013-03-31: 243 mg via ORAL
  Filled 2013-03-31: qty 3

## 2013-03-31 MED ORDER — FENTANYL CITRATE 0.05 MG/ML IJ SOLN
INTRAMUSCULAR | Status: AC
  Filled 2013-03-31: qty 2

## 2013-03-31 MED ORDER — SODIUM CHLORIDE 0.9 % IV SOLN
INTRAVENOUS | Status: DC
  Administered 2013-03-31: 16:00:00 via INTRAVENOUS

## 2013-03-31 MED ORDER — NITROGLYCERIN IN D5W 200-5 MCG/ML-% IV SOLN: 2.0000 ug/min | INTRAVENOUS | Status: DC

## 2013-03-31 MED ORDER — ASPIRIN 81 MG PO CHEW: 324.0000 mg | CHEWABLE_TABLET | ORAL | Status: DC

## 2013-03-31 MED ORDER — ACETAMINOPHEN 325 MG PO TABS: 650.0000 mg | ORAL_TABLET | ORAL | Status: DC | PRN

## 2013-03-31 MED ORDER — METOPROLOL TARTRATE 50 MG PO TABS
50.0000 mg | ORAL_TABLET | Freq: Two times a day (BID) | ORAL | Status: DC
  Administered 2013-03-31 – 2013-04-02 (×4): 50 mg via ORAL
  Filled 2013-03-31 (×5): qty 1

## 2013-03-31 MED ORDER — METOPROLOL TARTRATE 25 MG PO TABS
25.0000 mg | ORAL_TABLET | ORAL | Status: AC
  Administered 2013-03-31: 25 mg via ORAL
  Filled 2013-03-31: qty 1

## 2013-03-31 MED ORDER — SODIUM CHLORIDE 0.9 % IV SOLN: 250.0000 mL | INTRAVENOUS | Status: DC | PRN

## 2013-03-31 MED ORDER — SODIUM CHLORIDE 0.9 % IJ SOLN: 3.0000 mL | INTRAMUSCULAR | Status: DC | PRN

## 2013-03-31 MED FILL — Sodium Chloride IV Soln 0.9%: INTRAVENOUS | Qty: 50 | Status: AC

## 2013-03-31 NOTE — Progress Notes (Signed)
ANTICOAGULATION CONSULT NOTE - Follow Up Consult  Pharmacy Consult for heparin Indication: RCA stenosis  Labs:  Recent Labs  03/29/13 2125 03/29/13 2129 03/29/13 2130 03/30/13 0505 03/30/13 0844 03/30/13 1306 03/30/13 1740 03/30/13 1745 03/31/13 0300  HGB 16.3 17.0  --  14.4  --   --   --   --  14.3  HCT 45.5 50.0  --  40.2  --   --   --   --  39.7  PLT 156  --   --  170  --   --   --   --  147*  LABPROT  --   --  13.3  --   --   --   --   --   --   INR  --   --  1.02  --   --   --   --   --   --   HEPARINUNFRC  --   --   --   --   --   --  0.21*  --  0.39  CREATININE 1.16 1.20  --  0.89  --   --   --   --  1.07  TROPONINI  --   --   --   --  >20.00* >20.00*  --  >20.00*  --     Assessment/Plan:  47yo male now therapeutic on heparin after rate increase.  Will continue gtt at current rate and confirm stable with additional level.  Vernard Gambles, PharmD, BCPS  03/31/2013,4:14 AM

## 2013-03-31 NOTE — Cardiovascular Report (Signed)
NAME:  Ricky Roberts, Ricky Roberts NO.:  [id number]  MEDICAL RECORD NO.:  [id number]  LOCATION:  2911                         FACILITY:  [facility]  PHYSICIAN:  Nicki Guadalajara, M.D.     DATE OF BIRTH:  04/18/1966  DATE OF PROCEDURE:  03/29/2013 DATE OF DISCHARGE:                           CARDIAC CATHETERIZATION   PROCEDURES:  Emergent cardiac catheterization, the diagnostic coronary angiography; percutaneous coronary intervention with percutaneous transluminal coronary angioplasty, thrombectomy, and stenting of a totally occluded left circumflex coronary artery; left ventriculography; distal aortography.  INDICATIONS:  Mr. Ricky Roberts is a 47 year old African American gentleman without any known prior cardiac history.  He apparently developed substernal chest pain earlier this evening, which lasted approximately 1-1/2 to 2 hours before calling EMS.  When EMS arrived home, an ECG showed ST elevation inferiorly.  A code STEMI was activated.  En route to [facility], he went into torsades de pointes and required 4 shocks for resuscitation and restoration of sinus rhythm. Initially, post shock, he was bradycardiac and did have temporary pacer pads placed initially.  Upon arrival to [facility], the patient was still at 7/10 chest pain.  ECG showed inferolateral ST elevation with precordial ST-segment depression.  He was taken emergently to the cardiac catheterization laboratory for emergent catheterization and probable intervention.  DESCRIPTION OF PROCEDURE:  After premedication with Versed 2 mg plus fentanyl 50 mcg, the patient was prepped and draped in usual fashion. His right femoral artery was punctured anteriorly and a 6-French sheath was inserted without difficulty.  Initially, the regular J-wire was unable to be advanced above the right common iliac into the distal aorta due to apparent significant stenosis.  When a Glidewire was then used, again there was also  difficulty in passing this stenosis, but ultimately with right coronary artery catheter support, the Glidewire was able to traverse the apparent right common iliac stenosis and was then able to be advanced up the aorta.  The right catheter was then used for initial angiography into the native right coronary artery.  Exchanges were then used with a long exchange wire for all catheter exchanges.  A 6-French FL-4 catheter was then inserted and selective angiography in the left coronary system was performed.  This confirmed total occlusion of the circumflex vessel.  A 6-French XB3.5 guide was then used in attempt to open up the totally occluded circumflex vessel.  Angiomax bolus plus infusion was administered.  An ACT was documented to be therapeutic. Asahi medium wire was advanced into the circumflex and was able to cross the total occlusion.  Initially, this was advanced into a marginal vessel.  A 2.0 x 15 mm Emerge Monorail balloon was used for dilatation at the total occluded site.  Once flow was re-established, it did appear also to be narrowing in the AV groove circumflex, mid, as well as with distal bifurcation stenosis of approximately 90%.  The wire was then advanced from the marginal vessel into the distal circumflex.  A 2-0 balloon was then advanced to the distal circumflex where the vessel was small caliber, but diffusely diseased to 90%, and several dilatations were made at this site.  There did appear to be significant  thrombus burden at the site of the initial total occlusion.  A Terumo PriorityOne AC thrombectomy catheter was then inserted and several runs were made with clot retrieval.  The thrombectomy catheter was then removed.  It was felt that there also was bifurcation stenosis of the OM-1 and AV groove circumflex just beyond the circumflex occlusion.  It was felt that a stent initially would be just placed in the proximal circumflex, so as not to jail the marginal  vessel.  A 3.0 x15 mm Xience Xpedition stent was then inserted and dilated x2 at 12 and 14 atmospheres.  A 3.25 x 12 mm noncompliant Quantum Apex balloon was used for post-stent dilatation at 13 and 14 atmospheres corresponding to 3.30-mm size. Scout angiography confirmed an excellent angiographic result at the stented segment with 100% occlusion being reduced to 0%.  The stent ended proximal to the marginal AV groove bifurcation.  The marginal 1 vessel had 50% ostial narrowing followed by 30% mid narrowing.  The ostium of the AV groove had 60% narrowing.  The previous distal circumflex at distal bifurcation was a smaller marginal vessel which had been angioplastied, was reduced from 90% to less than 30%.  At this point, there was brisk TIMI-3 flow in the entire circumflex vessel and the decision was made to continue the patient on bivalirudin.  He also was started on IV nitroglycerin drip and since the patient will require staged intervention to a high-grade right coronary artery lesion, relook of the circumflex and the bifurcation stenoses will be done at that time.  A 6-French pigtail catheter was then inserted and RAO ventriculography was performed.  The pigtail catheter was then pulled back into the descending aorta to further evaluate the probable right common iliac stenosis.  Distal aortography was done above the renal arteries, but since the iliac system was not well visualized, an additional injection was done more distally just proximal to the iliac bifurcation.  The arterial sheath was sutured in place.  The patient was continued on Angiomax and will be continued for approximately 4 hours at a reduced dose.  He left the cardiac catheterization laboratory and was transported to the coronary care unit with stable hemodynamics, chest pain-free.  HEMODYNAMIC DATA:  Central aortic pressure 106/71, left ventricular pressure 106/21, post A-wave 31.  ANGIOGRAPHIC DATA:  Left main  coronary artery was a long vessel which bifurcated into an LAD and left circumflex system.  The LAD gave rise to 2 proximal diagonal vessels, proximal septal perforating artery and extended and wrapped around the LV apex.  The circumflex vessel was totally occluded proximally.  The right coronary artery had 20% proximal narrowing and then 85% to 90% smooth narrowing just proximal above marginal takeoff.  The RCA supplied the PDA and posterolateral vessel.  Following intervention to the circumflex vessel with PTCA, thrombectomy and ultimate stenting of the proximal circumflex with a 3.0 x 15 mm Expedition DES stent, the 100% occlusion was reduced to 0%.  There was evidence for 50% narrowing at the ostium of the first marginal vessel with 30% narrowing in the proximal third of the marginal vessel.  The ostium of the AV groove circumflex after the stent was 60% narrowed. The distal bifurcation stenosis in a small caliber vessel which was 90% was reduced to less than 30% with PTCA.  RAO ventriculography revealed an ejection fraction of approximately 40%. There was focal mid anterolateral hypocontractility, most likely corresponding to circumflex territory and mid distal inferior hypocontractility.  Distal aortography revealed widely  patent renal arteries.  There was a 90% stenosis at the proximal portion of the right common iliac artery.  IMPRESSION: 1. 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 2. Normal left anterior descending coronary artery. 3. 20% proximal right coronary artery narrowing with 85% to 90% mid     smooth right coronary artery narrowing. 4. 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 5. Moderate global hypokinesis with ejection fraction of approximately     40%, with focal mid anterolateral and mid distal inferior     hypocontractility. 6. Successful percutaneous coronary  intervention to the total occluded     circumflex with percutaneous transluminal coronary     angioplasty/thrombectomy/stenting of the proximal circumflex with     100% occlusion being reduced to 0% with ultimate insertion of a 3.0     x 15 mm Xience Xpedition stent, post dilated 3.3 mm, and evidence     for percutaneous transluminal coronary angioplasty of the distal     circumflex bifurcation with 90% being reduced to less than 30%, but     evidence for residual bifurcation stenosis in the obtuse marginal 1     and mid atrioventricular groove circumflex of 50% to 60%, with TIMI-     3 flow.  The patient arrived to [facility] approximately 2100.  The patient arrived to the cardiac catheterization laboratory at 2338, and time of the first balloon inflation was 2204.  The patient most likely will require stage intervention to the right coronary artery.          ______________________________ Nicki Guadalajara, M.D.     TK/MEDQ  D:  03/29/2013  T:  03/31/2013  Job:  [id number]  cc:   L. Lupe Carney, M.D.

## 2013-03-31 NOTE — Progress Notes (Signed)
  Echocardiogram 2D Echocardiogram has been performed.  Ricky Roberts 03/31/2013, 11:12 AM 

## 2013-03-31 NOTE — Progress Notes (Signed)
[facility]  Subjective: Up out of bed and sitting in chair. Family by bedside. No further CP this am. Nausea resolved with zofran and phenergan. HA resolved with morphine. He denies right groin, back and flank pain. No bleeding.   Objective: Vital signs in last 24 hours: Temp:  [98.3 F (36.8 C)-99.5 F (37.5 C)] 99.1 F (37.3 C) (04/28 0400) Pulse Rate:  [74-95] 89 (04/28 0700) Resp:  [19-28] 26 (04/28 0700) BP: (93-150)/(54-93) 110/63 mmHg (04/28 0700) SpO2:  [92 %-99 %] 95 % (04/28 0700) Last BM Date: 03/29/13  Intake/Output from previous day: 04/27 0701 - 04/28 0700 In: 3082.5 [P.O.:600; I.V.:2432.5; IV Piggyback:50] Out: 4075 [Urine:3575; Emesis/NG output:500] Intake/Output this shift:    Medications Current Facility-Administered Medications  Medication Dose Route Frequency Provider Last Rate Last Dose  . 0.9 %  sodium chloride infusion   Intravenous Continuous Lennette Bihari, MD 150 mL/hr at 03/30/13 0600    . 0.9 %  sodium chloride infusion   Intravenous Continuous PRN Lennette Bihari, MD 10 mL/hr at 03/30/13 1439    . acetaminophen (TYLENOL) tablet 650 mg  650 mg Oral Q4H PRN Lennette Bihari, MD   650 mg at 03/30/13 1533  . ALPRAZolam Prudy Feeler) tablet 0.25 mg  0.25 mg Oral TID PRN Abelino Derrick, PA-C   0.25 mg at 03/30/13 1937  . amiodarone (NEXTERONE PREMIX) 360 mg/200 mL dextrose IV infusion  60 mg/hr Intravenous Continuous Lennette Bihari, MD 33.3 mL/hr at 03/31/13 0309 60 mg/hr at 03/31/13 0309  . amiodarone (NEXTERONE) IV bolus only 150 mg/100 mL  150 mg Intravenous Once [employer], PA-C      . antiseptic oral rinse (BIOTENE) solution 15 mL  15 mL Mouth Rinse BID Lennette Bihari, MD   15 mL at 03/30/13 1937  . aspirin EC tablet 81 mg  81 mg Oral Daily Lennette Bihari, MD   81 mg at 03/30/13 1126  . atorvastatin (LIPITOR) tablet 80 mg  80 mg Oral q1800 Lennette Bihari, MD   80 mg at 03/30/13 1749  . heparin ADULT infusion 100 units/mL  (25000 units/250 mL)  1,450 Units/hr Intravenous Continuous Drake Leach Rumbarger, RPH 14.5 mL/hr at 03/31/13 0309 1,450 Units/hr at 03/31/13 0309  . lisinopril (PRINIVIL,ZESTRIL) tablet 5 mg  5 mg Oral Daily Lennette Bihari, MD   5 mg at 03/30/13 1126  . magnesium oxide (MAG-OX) tablet 400 mg  400 mg Oral BID Lennette Bihari, MD   400 mg at 03/30/13 2117  . metoprolol tartrate (LOPRESSOR) tablet 25 mg  25 mg Oral QID Lennette Bihari, MD   25 mg at 03/30/13 2117  . morphine 2 MG/ML injection 1 mg  1 mg Intravenous Q3H PRN Brittainy Simmons, PA-C   1 mg at 03/30/13 1831  . nitroGLYCERIN 0.2 mg/mL in dextrose 5 % infusion  2-200 mcg/min Intravenous Continuous Lennette Bihari, MD 3 mL/hr at 03/30/13 2300 10 mcg/min at 03/30/13 2300  . ondansetron (ZOFRAN) 8 mg/NS 50 ml IVPB  8 mg Intravenous Q6H PRN Brittainy Simmons, PA-C   8 mg at 03/30/13 1522  . promethazine (PHENERGAN) tablet 6.26 mg  6.26 mg Oral Q6H PRN Brittainy Simmons, PA-C   6.26 mg at 03/30/13 1831  . Ticagrelor (BRILINTA) tablet 90 mg  90 mg Oral BID Lennette Bihari, MD   90 mg at 03/30/13 2118  . traMADol (ULTRAM) tablet 50 mg  50 mg Oral Q6H PRN Eda Paschal  Kilroy, PA-C      . zolpidem (AMBIEN) tablet 5 mg  5 mg Oral QHS PRN Abelino Derrick, PA-C        PE: General appearance: alert, cooperative, no distress and out of bed and in a chair Lungs: clear to auscultation bilaterally Heart: regular rate and rhythm Extremities: no LEE Pulses: 2+ and symmetric Skin: warm and dry Neurologic: Grossly normal  Lab Results:   Recent Labs  03/29/13 2125 03/29/13 2129 03/30/13 0505 03/31/13 0300  WBC 16.4*  --  12.4* 12.8*  HGB 16.3 17.0 14.4 14.3  HCT 45.5 50.0 40.2 39.7  PLT 156  --  170 147*   BMET  Recent Labs  03/29/13 2125 03/29/13 2129 03/30/13 0505 03/31/13 0300  NA 140 140 137 139  K 3.5 3.4* 4.3 3.9  CL 102 106 104 105  CO2 19  --  22 26  GLUCOSE 149* 149* 133* 107*  BUN 13 14 11 8   CREATININE 1.16 1.20 0.89 1.07  CALCIUM  9.1  --  8.2* 8.8   PT/INR  Recent Labs  03/29/13 2130  LABPROT 13.3  INR 1.02   Cardiac Enzymes Cardiac Panel (last 3 results)  Recent Labs  03/30/13 0844 03/30/13 1306 03/30/13 1745  TROPONINI >20.00* >20.00* >20.00*    Assessment/Plan  Principal Problem:   STEMI (ST elevation myocardial infarction)  Plan: No further CP this am. Nausea resolved zofran and phenergan. HA resolved with morphine. He denies right groin, back and flank pain. No bleeding. No further NSVT on telemetry for the past 12 hrs. Maintainig NSR. K+ is 3.9. Still on Amiodarone drip at 60 mg/hr. Consider titratting down to 30 mg/hr. If still stable may start PO amiodarone. Troponins were recycled yesterday Q6h. All values still >20.0. Dr. Tresa Endo to discuss with patient plan for possible 2nd LHC for PCI on the RCA. Will need to do cath via the left femoral artery due to poor access on the right, secondary to right CIA stenosis. Keep on IV heparin for now. BP is stable. Will continue to monitor.     LOS: 2 days    Brittainy M. Delmer Islam 03/31/2013 8:24 AM   Patient seen and examined. Agree with assessment and plan. Pt has had recurrent episodes of chest pressure. No discomfort at present. He has high grade RCA stenosis and also bifurcation stenosis in AV groove and OM of LCx. With recurrent symptoms will restudy today with plan to PCI RCA and re-look at LCX for possible additional site intervention. Rhythm now stable. Titrate beta-blocker therapy. Continue heparin. Discussed with pt and wife who agree to proceed.   Lennette Bihari, MD, [facility] 03/31/2013 9:16 AM

## 2013-03-31 NOTE — Progress Notes (Signed)
Left femoral sheath removed and pressure held x 25 minutes. Vital signs remain stable. Hemostasis obtained and pressure dressing applied to site. Post procedure instructions given and patient voiced understanding. Patient tolerated well.

## 2013-03-31 NOTE — Care Management Note (Signed)
    Page 1 of 1   03/31/2013     11:02:02 AM   CARE MANAGEMENT NOTE 03/31/2013  Patient:  Ricky Roberts,Ricky Roberts   Account Number:  [id number]  Date Initiated:  03/31/2013  Documentation initiated by:  Junius Creamer  Subjective/Objective Assessment:   adm w mi     Action/Plan:   lives w wife, pcp dr r mitchell   Anticipated DC Date:     Anticipated DC Plan:        DC Planning Services  CM consult  Medication Assistance      Choice offered to / List presented to:             Status of service:   Medicare Important Message given?   (If response is "NO", the following Medicare IM given date fields will be blank) Date Medicare IM given:   Date Additional Medicare IM given:    Discharge Disposition:    Per UR Regulation:  Reviewed for med. necessity/level of care/duration of stay  If discussed at Long Length of Stay Meetings, dates discussed:    Comments:  4/28 1032 debbie Columbia Pandey rn,bsn gave pt 30day free brilinta card and copay assist card.

## 2013-03-31 NOTE — CV Procedure (Signed)
Cardiac Catherization/PCI to RCA   Ricky Roberts, 47 y.o., male  Full note dictated; see diagram in chart  DICTATION # [phone], [id number]  Ao: 121/80  Relook of LCX: widely patent proximal stent without restosis of distal LXx PTCA site and improvement in bifurcation OM1 and Mid AV groove ostial lesions to 40%  Successful PCI with 2.5 mm x 15 Angiosculpt, 3.25 x 18 Xiehnce Xpedition DES stent post dilated with 3.5 x 15 Brush Trek from 80 - 85% to 0 and PTCA via stent strut to RV Marginal ostial stenosis of 95% with 2.0 x 12 Sprinter balloon to <40%.  Angiomax/IC and IV NTG, pt already on ASA and brilinta.  Tolerated well.    Lennette Bihari, MD, [facility] 03/31/2013 2:55 PM

## 2013-04-01 ENCOUNTER — Encounter (HOSPITAL_COMMUNITY): Payer: Self-pay | Admitting: Cardiology

## 2013-04-01 DIAGNOSIS — Z95820 Peripheral vascular angioplasty status with implants and grafts: Secondary | ICD-10-CM

## 2013-04-01 HISTORY — DX: Peripheral vascular angioplasty status with implants and grafts: Z95.820

## 2013-04-01 LAB — HEMOGLOBIN A1C: Mean Plasma Glucose: 108 mg/dL (ref <117)

## 2013-04-01 LAB — BASIC METABOLIC PANEL
CO2: 26 mEq/L (ref 19–32)
Chloride: 103 mEq/L (ref 96–112)
Glucose, Bld: 104 mg/dL — ABNORMAL HIGH (ref 70–99)
Sodium: 137 mEq/L (ref 135–145)

## 2013-04-01 LAB — CBC
HCT: 37.8 % — ABNORMAL LOW (ref 39.0–52.0)
Hemoglobin: 13.1 g/dL (ref 13.0–17.0)
MCV: 86.5 fL (ref 78.0–100.0)
WBC: 9.3 10*3/uL (ref 4.0–10.5)

## 2013-04-01 LAB — LIPID PANEL
Cholesterol: 172 mg/dL (ref 0–200)
HDL: 45 mg/dL (ref >39)
Total CHOL/HDL Ratio: 3.8 RATIO
Triglycerides: 152 mg/dL — ABNORMAL HIGH (ref <150)

## 2013-04-01 LAB — MAGNESIUM: Magnesium: 2.1 mg/dL (ref 1.5–2.5)

## 2013-04-01 MED ORDER — PROMETHAZINE HCL 12.5 MG PO TABS
6.2500 mg | ORAL_TABLET | Freq: Four times a day (QID) | ORAL | Status: DC | PRN
  Filled 2013-04-01: qty 1

## 2013-04-01 MED ORDER — LISINOPRIL 10 MG PO TABS
10.0000 mg | ORAL_TABLET | Freq: Every day | ORAL | Status: DC
  Administered 2013-04-02: 10 mg via ORAL
  Filled 2013-04-01: qty 1

## 2013-04-01 MED ORDER — POTASSIUM CHLORIDE CRYS ER 20 MEQ PO TBCR
40.0000 meq | EXTENDED_RELEASE_TABLET | Freq: Once | ORAL | Status: AC
  Administered 2013-04-01: 40 meq via ORAL
  Filled 2013-04-01: qty 2

## 2013-04-01 MED FILL — Sodium Chloride IV Soln 0.9%: INTRAVENOUS | Qty: 50 | Status: AC

## 2013-04-01 NOTE — Progress Notes (Addendum)
I have seen and evaluated the patient this AM along with Wilburt Finlay, PA. I agree with his findings, examination as well as impression recommendations.  On DAPT, ACE-I & BB, statin --> BP & HR stable, with "restrictive filling pressures on Echo, will up titrate ACE-I for additioanl afterload reduction.  No further CP since PCI yesterday.  Groin stable. On oral Amiodarone load, 400mg  bid to complete the week --> may not need long term Rx as the likely etiology was ischemic.  No further arrhythmias.  Ambulate to day with [facility], to tele.  Agree with likely home tomorrow.   Marykay Lex, M.D., M.S. [facility] [address] [place], [place]  [address]  [phone] Pager # [phone] 04/01/2013 10:24 AM

## 2013-04-01 NOTE — Cardiovascular Report (Signed)
NAME:  Ricky Roberts, Ricky Roberts NO.:  [id number]  MEDICAL RECORD NO.:  [id number]  LOCATION:  2911                         FACILITY:  [facility]  PHYSICIAN:  Nicki Guadalajara, M.D.     DATE OF BIRTH:  1966-03-18  DATE OF PROCEDURE:  03/31/2013 DATE OF DISCHARGE:                           CARDIAC CATHETERIZATION   PROCEDURES:  Relook at the left coronary system with angiography, percutaneous coronary intervention with AngioSculpt cutting balloon atherotomy and stenting of the mid right coronary artery, and percutaneous transluminal coronary angioplasty via the stent of the ostium of the marginal branch of the right coronary artery.  INDICATIONS:  Mr. Ricky Roberts is a 47 year old African American gentleman without prior cardiac history, who presented to [facility] on March 29, 2013, in the setting of an acute inferior wall ST-segment elevation myocardial infarction.  In transit from home via EMS to the [facility] Emergency Room, the patient did have recurrent episodes of torsade de pointes/polymorphic ventricular tachycardia, for which he required 4 shock treatments.  He was taken acutely to the cardiac catheterization laboratory and was found to have 85% to 90% stenosis in his mid right coronary artery, but the culprit vessel was total occlusion of the proximal circumflex.  The circumflex vessel, once recanalized, had significant thrombus burden, requiring thrombectomy.  There also was diffuse disease distally and he underwent PTCA of the distal vessel and stent was placed in the most proximal vessel prior to narrowings in the OM-1 and AV groove circumflex, but these were felt to be approximately 60% at that time, and not intervened upon.  In addition, the patient was found to have 90% focal stenosis in the right common iliac artery.  The patient has had episodes of nonsustained VT, for which he has been on amiodarone therapy.  He also has had recurrent episodes of chest tightness  and pressure.  With his recurrent symptomatology, he is now brought back to the cardiac catheterization laboratory for probable intervention to the RCA and relook at the left circumflex system to see if additional intervention was necessary.  DESCRIPTION OF PROCEDURE:  After premedication with Versed 2 mg plus fentanyl 50 mcg, the patient was prepped and draped in usual fashion. His left femoral artery was used for this procedure today due to the fact that the patient has known 90% right common iliac stenosis and ultimately will require PV intervention to this site.  A 6-French sheath was inserted without difficulty.  Initially, a 5-French diagnostic left 4 catheter was used for selective angiography in the left coronary system.  This revealed the stent to be widely patent without restenosis. In addition, the distal bifurcation circumflex stenosis was also widely patent which had undergone angioplasty.  Beyond the stented segment at the bifurcation of the OM-1 and mid AV groove circumflex, these lesions actually seemed improved and were now approximately 40% with 40% narrowing in the proximal third of the marginal vessel.  For this reason, it was felt that no additional intervention was necessary in the circumflex territory and direction was therefore directed at the right coronary artery.  Initially, an FR-4 guide was inserted, but due to significant catheter dampening, this was exchanged for a right  guide with side holes.  Angiomax bolus plus infusion was administered.  The patient was also started on intravenous fluids and also IV nitroglycerin.  A Prowater wire was advanced down into the distal right coronary artery after therapeutic anticoagulation was determined.  Since there was a bifurcation stenosis with high-grade stenosis in the marginal branch, originating from the stenosed 85% RCA narrowing, the decision was made to initially use AngioSculpt cutting balloon and several  inflations were made at 6, 9, and 10 atmospheres.  A 3.25 x 18 mm Xience Xpedition DES stent was then inserted into the proximal-to-mid right coronary artery which had to jail this marginal branch.  This was dilated x2 at 12 and 13 atmospheres.  A 3.5 x 15 mm noncompliant TREK balloon was used for post-stent dilatation at 11 and 12 atmospheres, corresponding to 3.49-mm vessel size.  Scout angiography confirmed an excellent angiographic result in the main portion of the right coronary artery.  Again, there was still 95% ostial stenosis in the marginal vessel, arising from the stented segment which did not improve with IC nitroglycerin.  Therefore, an attempt was made at trying to open up some, it improved blood flow into this marginal branch.  Initially, the same Prowater wire was advanced proximally into the branch, but was not able to be passed further due to continued prolapse down the right coronary artery.  This occurred despite using a 2.0 x 12 Sprinter Legend balloon.  The wire was then exchanged for a ChoICE PT Light Support, and this ultimately was able to be advanced into the marginal branch.  A Sprinter Legend 2.0 x 12 mm balloon was then inserted and dilatation into the ostium of this marginal branch was done at 5, 8, and 12 atmospheres up to a total of 2 minutes.  Scout angiography confirmed good angiographic result at this site.  The patient was pain-free.  He remained stable with stable hemodynamics.  Of note, with initial balloon dilatations in his right coronary artery, he did have significant ST elevation and also had some mild chest tightness which he had experienced since his intervention several days ago.  The arterial sheath was sutured in place with plans for sheath removal 2 hours after discontinuance of the Angiomax infusion.  The patient had already been treated with Brilinta and has been on Brilinta therapy since his acute intervention done on March 29, 2013.  HEMODYNAMIC DATA:  Central aortic pressure was 121/80.  ANGIOGRAPHIC DATA:  Left main coronary artery is a long caliber normal vessel which bifurcated into the LAD and left circumflex system.  The LAD was free of significant disease and it is noted it gave rise to 2 diagonal vessels, several septal perforating arteries, and extended around the apex.  The circumflex vessel had a widely patent stent proximally at the site of previous total occlusion.  The stent ended proximal to the bifurcation into the OM-1 and mid AV groove circumflex.  These ostial lesions in the AV groove circumflex and OM vessel seemed improved and now no greater than 40%.  There also was 40% narrowing in the proximal portion of the marginal vessel.  The distal circumflex and marginal vessels at the site of PTCA were patent without restenosis.  The right coronary artery had 20% to 30% previously noted proximal narrowing.  The mid RCA in the region of the anterior marginal branch narrowed down to 85%.  There was 90% to 95% ostial narrowing in this marginal branch.  Following successful cutting balloon atherotomy  using AngioSculpt balloon as well as stenting of the proximal to mid right coronary artery with ultimate 3.25 x 18 mm Xience Xpedition stent post dilated to 3.49 mm, the RCA lesion was reduced to 0%.  Following PTCA of the anterior RV marginal branch with a 2.0 x 12 mm Sprinter balloon, the 95% stenosis was reduced to less than 40%.  There was brisk TIMI-3 flow. There was no evidence for dissection.  IMPRESSION: 1. Widely patent left circumflex proximally stented segment at the     site of total occlusion in this patient who presented with ST-     segment elevation myocardial infarction complicated by polymorphic     VT/torsade de pointes on March 29, 2013, with improvement in     residual narrowing of 40% at the ostium of the obtuse marginal 1     and mid atrioventricular groove circumflex, 40% in  the obtuse     marginal 1 vessel in the proximal third, and no restenosis at the     distal circumflex percutaneous transluminal coronary angioplasty     site. 2. Successful cutting balloon atherotomy/AngioSculpt and stenting of     the right coronary artery with 85%     stenosis being reduced to 0% and percutaneous transluminal coronary     angioplasty via the stent strut into the marginal branch with 95%     ostial stenosis being reduced to less than 40%. 3. Bivalirudin/the patient already on aspirin/Brilinta, and IC and IV     nitroglycerin administration.          ______________________________ Nicki Guadalajara, M.D.     TK/MEDQ  D:  03/31/2013  T:  04/01/2013  Job:  [id number]

## 2013-04-01 NOTE — Progress Notes (Signed)
[facility]  Subjective: No CP or SOB  Objective: Vital signs in last 24 hours: Temp:  [98.6 F (37 C)-99.7 F (37.6 C)] 99.2 F (37.3 C) (04/29 0400) Pulse Rate:  [73-96] 81 (04/29 0700) Resp:  [19-34] 23 (04/29 0700) BP: (96-153)/(59-107) 112/62 mmHg (04/29 0700) SpO2:  [92 %-100 %] 95 % (04/29 0700) Weight:  [217 lb 14.4 oz (98.839 kg)] 217 lb 14.4 oz (98.839 kg) (04/29 0300) Last BM Date: 03/29/13  Intake/Output from previous day: 04/28 0701 - 04/29 0700 In: 2788.7 [P.O.:890; I.V.:1898.7] Out: 3975 [Urine:3975] Intake/Output this shift:    Medications Current Facility-Administered Medications  Medication Dose Route Frequency Provider Last Rate Last Dose  . 0.9 %  sodium chloride infusion   Intravenous Continuous Lennette Bihari, MD 150 mL/hr at 03/30/13 0600    . 0.9 %  sodium chloride infusion   Intravenous Continuous PRN Lennette Bihari, MD 10 mL/hr at 03/30/13 1439    . 0.9 %  sodium chloride infusion   Intravenous Continuous Lennette Bihari, MD 150 mL/hr at 03/31/13 1900    . acetaminophen (TYLENOL) tablet 650 mg  650 mg Oral Q4H PRN Lennette Bihari, MD   650 mg at 03/30/13 1533  . acetaminophen (TYLENOL) tablet 650 mg  650 mg Oral Q4H PRN Lennette Bihari, MD      . ALPRAZolam Prudy Feeler) tablet 0.25 mg  0.25 mg Oral TID PRN Abelino Derrick, PA-C   0.25 mg at 03/30/13 1937  . amiodarone (NEXTERONE) IV bolus only 150 mg/100 mL  150 mg Intravenous Once Lennette Bihari, MD      . amiodarone (PACERONE) tablet 400 mg  400 mg Oral BID Lennette Bihari, MD   400 mg at 03/31/13 2213  . antiseptic oral rinse (BIOTENE) solution 15 mL  15 mL Mouth Rinse BID Lennette Bihari, MD   15 mL at 03/31/13 2000  . aspirin EC tablet 81 mg  81 mg Oral Daily Lennette Bihari, MD   81 mg at 03/31/13 0902  . atorvastatin (LIPITOR) tablet 80 mg  80 mg Oral q1800 Lennette Bihari, MD   80 mg at 03/31/13 1715  . lisinopril (PRINIVIL,ZESTRIL) tablet 5 mg  5 mg Oral Daily Lennette Bihari,  MD   5 mg at 03/31/13 0902  . magnesium oxide (MAG-OX) tablet 400 mg  400 mg Oral BID Lennette Bihari, MD   400 mg at 03/31/13 2212  . metoprolol (LOPRESSOR) tablet 50 mg  50 mg Oral BID Lennette Bihari, MD   50 mg at 03/31/13 2213  . morphine 2 MG/ML injection 1 mg  1 mg Intravenous Q3H PRN Brittainy Simmons, PA-C   1 mg at 03/30/13 1831  . nitroGLYCERIN 0.2 mg/mL in dextrose 5 % infusion  2-200 mcg/min Intravenous Continuous Lennette Bihari, MD 3 mL/hr at 03/30/13 2300 10 mcg/min at 03/30/13 2300  . nitroGLYCERIN 0.2 mg/mL in dextrose 5 % infusion  2-200 mcg/min Intravenous Continuous Lennette Bihari, MD 3 mL/hr at 03/31/13 1600 10 mcg/min at 03/31/13 1600  . ondansetron (ZOFRAN) 8 mg/NS 50 ml IVPB  8 mg Intravenous Q6H PRN Brittainy Simmons, PA-C   8 mg at 03/30/13 1522  . ondansetron (ZOFRAN) injection 4 mg  4 mg Intravenous Q6H PRN Lennette Bihari, MD      . promethazine (PHENERGAN) tablet 6.26 mg  6.26 mg Oral Q6H PRN Brittainy Simmons, PA-C   6.26 mg at 03/30/13 1831  .  Ticagrelor (BRILINTA) tablet 90 mg  90 mg Oral BID Lennette Bihari, MD   90 mg at 03/31/13 2212  . traMADol (ULTRAM) tablet 50 mg  50 mg Oral Q6H PRN Eda Paschal Kilroy, PA-C      . zolpidem (AMBIEN) tablet 5 mg  5 mg Oral QHS PRN Abelino Derrick, PA-C        PE: General appearance: alert, cooperative and no distress Lungs: Mild basilar rales.  No wheeze. Heart: regular rate and rhythm, S1, S2 normal, no murmur, click, rub or gallop Extremities: No LEE Pulses: 2+ and symmetric 2+ left PT.  Deminished right pedal pulses.  Foot warm Skin: Left groin:  Mildly tender.  No hematoma. Neurologic: Grossly normal  Lab Results:   Recent Labs  03/30/13 0505 03/31/13 0300 04/01/13 0503  WBC 12.4* 12.8* 9.3  HGB 14.4 14.3 13.1  HCT 40.2 39.7 37.8*  PLT 170 147* 129*   BMET  Recent Labs  03/30/13 0505 03/31/13 0300 04/01/13 0503  NA 137 139 137  K 4.3 3.9 3.4*  CL 104 105 103  CO2 22 26 26   GLUCOSE 133* 107* 104*  BUN 11 8  9   CREATININE 0.89 1.07 1.05  CALCIUM 8.2* 8.8 8.7   PT/INR  Recent Labs  03/29/13 2130  LABPROT 13.3  INR 1.02   Cholesterol  Recent Labs  04/01/13 0503  CHOL 172   Lipid Panel     Component Value Date/Time   CHOL 172 04/01/2013 0503   TRIG 152* 04/01/2013 0503   HDL 45 04/01/2013 0503   CHOLHDL 3.8 04/01/2013 0503   VLDL 30 04/01/2013 0503   LDLCALC 97 04/01/2013 0503    Cardiac Panel (last 3 results)  Recent Labs  03/30/13 0844 03/30/13 1306 03/30/13 1745  TROPONINI >20.00* >20.00* >20.00*      Assessment/Plan  Principal Problem:   STEMI (ST elevation myocardial infarction), inferior lateral wall Active Problems:   Cardiac arrest, secondary to torsodes with 4 shocks by EMS    S/P coronary artery stent placement, LCX, emergently 03/29/13   CAD (coronary artery disease), residual RCA stenosis with recurrent angina  Plan:  SP LHC and PCI of totally occluded circumflex and staged PCI to RCA both with Xience Xpedition stents.  2D Echo pending.  BP and HR stable.  No VT on tele. Replace K.  Net fluids since admit - .  ASA, Amiodarone 400mg  BID, Brilinta, lipitor, lisinopril 5mg , lopressor 50mg  bid.  Transfer to tele.  Likely home tomorrow.    LOS: 3 days    Shellie Rogoff 04/01/2013 7:43 AM

## 2013-04-01 NOTE — Progress Notes (Signed)
CARDIAC REHAB PHASE I   PRE:  Rate/Rhythm: 75SR  BP:  Supine:   Sitting: 125/80  Standing:    SaO2:   MODE:  Ambulation: 350 ft   POST:  Rate/Rhythm: 80SR  BP:  Supine:   Sitting: 132/68  Standing:    SaO2:  1045-1155 Pt walked 350 ft with steady gait. Denied CP. Tired by end of walk. To recliner with call bell. Education completed with pt and wife. Many good questions. Discussed CRP 2 and permission given to refer to [place]. Pt states he will walk with nurse again later.   Luetta Nutting, RN BSN  04/01/2013 11:51 AM

## 2013-04-02 ENCOUNTER — Encounter (HOSPITAL_COMMUNITY): Payer: Self-pay | Admitting: Cardiology

## 2013-04-02 LAB — CBC
HCT: 41.7 % (ref 39.0–52.0)
Platelets: 163 10*3/uL (ref 150–400)
RDW: 12.7 % (ref 11.5–15.5)
WBC: 10 10*3/uL (ref 4.0–10.5)

## 2013-04-02 LAB — BASIC METABOLIC PANEL
Chloride: 103 mEq/L (ref 96–112)
GFR calc Af Amer: >90 mL/min (ref >90)
Potassium: 3.4 mEq/L — ABNORMAL LOW (ref 3.5–5.1)

## 2013-04-02 MED ORDER — ATORVASTATIN CALCIUM 80 MG PO TABS: 80.0000 mg | ORAL_TABLET | Freq: Every day | ORAL | Status: DC

## 2013-04-02 MED ORDER — LISINOPRIL 10 MG PO TABS: 10.0000 mg | ORAL_TABLET | Freq: Every day | ORAL | Status: DC

## 2013-04-02 MED ORDER — MAGNESIUM OXIDE 400 (241.3 MG) MG PO TABS: 400.0000 mg | ORAL_TABLET | Freq: Two times a day (BID) | ORAL | Status: DC

## 2013-04-02 MED ORDER — ASPIRIN 81 MG PO TBEC: 81.0000 mg | DELAYED_RELEASE_TABLET | Freq: Every day | ORAL | Status: DC

## 2013-04-02 MED ORDER — POTASSIUM CHLORIDE CRYS ER 20 MEQ PO TBCR
40.0000 meq | EXTENDED_RELEASE_TABLET | Freq: Once | ORAL | Status: AC
  Administered 2013-04-02: 40 meq via ORAL
  Filled 2013-04-02: qty 2

## 2013-04-02 MED ORDER — TICAGRELOR 90 MG PO TABS: 90.0000 mg | ORAL_TABLET | Freq: Two times a day (BID) | ORAL | Status: DC

## 2013-04-02 MED ORDER — METOPROLOL TARTRATE 50 MG PO TABS: 50.0000 mg | ORAL_TABLET | Freq: Two times a day (BID) | ORAL | Status: DC

## 2013-04-02 MED ORDER — AMIODARONE HCL 200 MG PO TABS: 200.0000 mg | ORAL_TABLET | Freq: Two times a day (BID) | ORAL | Status: DC

## 2013-04-02 NOTE — Progress Notes (Signed)
I have seen and evaluated the patient this AM along with Boyce Medici.  I agree with her findings, examination as well as impression recommendations.  He looks & feels good.  No further Angina post staged PCI on RCA.  No HF Sx.  Ambulating well with [facility].  BP & HR stable.    He is stable for d/c.  F/u with Dr. Tresa Endo.   Marykay Lex, M.D., M.S. [facility] [address] [place], [place]  [address]  [phone] Pager # [phone] 04/02/2013 2:59 PM

## 2013-04-02 NOTE — Progress Notes (Signed)
CARDIAC REHAB PHASE I   PRE:  Rate/Rhythm: 88SR  BP:  Supine: 110/60  Sitting:   Standing:    SaO2: 93%RA  MODE:  Ambulation: 550 ft   POST:  Rate/Rhythm: 89SR  BP:  Supine:   Sitting: 118/70  Standing:    SaO2: 94%RA 0850-0922 Pt walked 550 ft on RA with steady gait. Tolerated well.  Denied CP. No questions about ed yesterday. Referring to [place] Phase 2.   Luetta Nutting, RN BSN  04/02/2013 9:18 AM

## 2013-04-02 NOTE — Progress Notes (Signed)
[facility]  Subjective: Feels great. No further chest pain. Working with cardiac rehab w/o difficulty.   Objective: Vital signs in last 24 hours: Temp:  [97.6 F (36.4 C)-99 F (37.2 C)] 98.6 F (37 C) (04/30 0521) Pulse Rate:  [73-89] 73 (04/30 0521) Resp:  [17-18] 18 (04/30 0521) BP: (110-152)/(66-87) 110/72 mmHg (04/30 0521) SpO2:  [96 %-98 %] 97 % (04/30 0521) Last BM Date: 03/28/13  Intake/Output from previous day: 04/29 0701 - 04/30 0700 In: 560 [P.O.:560] Out: 1500 [Urine:1500] Intake/Output this shift: Total I/O In: 360 [P.O.:360] Out: -   Medications Current Facility-Administered Medications  Medication Dose Route Frequency Provider Last Rate Last Dose  . 0.9 %  sodium chloride infusion   Intravenous Continuous Lennette Bihari, MD 150 mL/hr at 03/30/13 0600    . 0.9 %  sodium chloride infusion   Intravenous Continuous PRN Lennette Bihari, MD 10 mL/hr at 03/30/13 1439    . 0.9 %  sodium chloride infusion   Intravenous Continuous Lennette Bihari, MD 150 mL/hr at 03/31/13 1900    . acetaminophen (TYLENOL) tablet 650 mg  650 mg Oral Q4H PRN Lennette Bihari, MD      . ALPRAZolam Prudy Feeler) tablet 0.25 mg  0.25 mg Oral TID PRN Abelino Derrick, PA-C   0.25 mg at 03/30/13 1937  . amiodarone (NEXTERONE) IV bolus only 150 mg/100 mL  150 mg Intravenous Once Lennette Bihari, MD      . amiodarone (PACERONE) tablet 400 mg  400 mg Oral BID Lennette Bihari, MD   400 mg at 04/01/13 2104  . antiseptic oral rinse (BIOTENE) solution 15 mL  15 mL Mouth Rinse BID Lennette Bihari, MD   15 mL at 04/01/13 0914  . aspirin EC tablet 81 mg  81 mg Oral Daily Lennette Bihari, MD   81 mg at 04/01/13 0913  . atorvastatin (LIPITOR) tablet 80 mg  80 mg Oral q1800 Lennette Bihari, MD   80 mg at 04/01/13 1748  . lisinopril (PRINIVIL,ZESTRIL) tablet 10 mg  10 mg Oral Daily Marykay Lex, MD      . magnesium oxide (MAG-OX) tablet 400 mg  400 mg Oral BID Lennette Bihari, MD   400 mg at  04/01/13 2104  . metoprolol (LOPRESSOR) tablet 50 mg  50 mg Oral BID Lennette Bihari, MD   50 mg at 04/01/13 2104  . morphine 2 MG/ML injection 1 mg  1 mg Intravenous Q3H PRN Maleta Pacha, PA-C   1 mg at 03/30/13 1831  . ondansetron (ZOFRAN) injection 4 mg  4 mg Intravenous Q6H PRN Lennette Bihari, MD      . promethazine (PHENERGAN) tablet 6.25 mg  6.25 mg Oral Q6H PRN Lennette Bihari, MD      . Ticagrelor [facility]) tablet 90 mg  90 mg Oral BID Lennette Bihari, MD   90 mg at 04/01/13 2104  . traMADol (ULTRAM) tablet 50 mg  50 mg Oral Q6H PRN Abelino Derrick, PA-C      . zolpidem (AMBIEN) tablet 5 mg  5 mg Oral QHS PRN Abelino Derrick, PA-C        PE: General appearance: alert, cooperative and no distress Lungs: clear to auscultation bilaterally Heart: regularly irregular rhythm Extremities: no LEE Pulses: 2+ and symmetric Skin: warm and dry Neurologic: Grossly normal  Lab Results:   Recent Labs  03/31/13 0300 04/01/13 0503 04/02/13 0525  WBC  12.8* 9.3 10.0  HGB 14.3 13.1 15.0  HCT 39.7 37.8* 41.7  PLT 147* 129* 163   BMET  Recent Labs  03/31/13 0300 04/01/13 0503  NA 139 137  K 3.9 3.4*  CL 105 103  CO2 26 26  GLUCOSE 107* 104*  BUN 8 9  CREATININE 1.07 1.05  CALCIUM 8.8 8.7   PT/INR No results found for this basename: LABPROT, INR,  in the last 72 hours Cholesterol  Recent Labs  04/01/13 0503  CHOL 172    Assessment/Plan  Principal Problem:   STEMI (ST elevation myocardial infarction), inferior lateral wall Active Problems:   Cardiac arrest, secondary to torsodes with 4 shocks by EMS    S/P coronary artery stent placement, LCX, emergently 03/29/13   S/P angioplasty with stent 03/31/13 to RV ostial marginal with DES Xience Xpedition stent  Plan:  S/P LHC and PCI of totally occluded circumflex and staged PCI to RCA both with Xience Xpedition stents. On DAPT with ASA + Brilinta. No further CP. He has been ambulating with cardiac rehab w/o issues. Maintaining  NSR on telemetry. No further arrhthymias. BP and HR both stable. He is hypokalemic with K+ of 3.4. Will replete with supplemental K+.  Plan for possible discharge home today. Will arrange f/u with Dr. Tresa Endo or MLP in 1-2 weeks. He will be enrolled in outpatient cardiac rehab.    LOS: 4 days    Anuel Sitter M. Sharol Harness, PA-C 04/02/2013 9:24 AM

## 2013-04-03 NOTE — Progress Notes (Signed)
Responded to code stemi and met family in consult room. Pt's wife was very tearful and needed prayer prior to seeing pt before going to cath lab. Stayed w/and updated family throughout stemi procedure. Family very thankful for help and spiritual support. Marjory Lies Chaplain  03/29/13 2110  Clinical Encounter Type  Visited With Family  Visit Type Code

## 2013-04-03 NOTE — Discharge Summary (Signed)
Physician Discharge Summary  Patient ID: Ricky Roberts MRN: [id number] DOB/AGE: May 13, 1966 47 y.o.  Admit date: 03/29/2013 Discharge date: 04/02/13  Admission Diagnoses: STEMI  Discharge Diagnoses:  Principal Problem:   STEMI (ST elevation myocardial infarction), inferior lateral wall Active Problems:   Cardiac arrest, secondary to torsodes with 4 shocks by EMS    S/P coronary artery stent placement, LCX, emergently 03/29/13   S/P angioplasty with stent 03/31/13 to RV ostial marginal with DES Xience Xpedition stent  NSVT  Discharged Condition: stable  Hospital Course:   The patient is a 47 y/o AAM, without prior cardiac history, who presented to [facility] on 03/29/13, in the setting of an acute inferior wall STEMI. In transit from home, via EMS to the [facility] ER, the patient did have recurrent episodes of torsade de pointes/polymorphic ventricular tachycardia, for which he required 4 shock treatments for resuscitation. On arrival, he was taken urgently to the cardiac catheterization laboratory, by Dr. Tresa Endo, and was found to have 85%-90% stenosis in his mid right coronary artery, but the culprit vessel was total occlusion of the proximal circumflex. The circumflex vessel, once recanalized, had significant thrombus burden, requiring thrombectomy. There also was diffuse disease distally and he underwent PTCA of the distal vessel and a DES  was placed in the most proximal vessel prior to narrowings in the OM-1 and AV groove circumflex, but these were felt to be approximately 60% at that time, and not intervened upon. In addition, the patient was found to have 90% focal stenosis in the right common iliac artery. Post cath, the patient had episodes of nonsustained VT, and was treated with amiodarone therapy. He also had recurrent episodes of chest tightness and pressure. With his recurrent symptomatology, he was taken back to the cardiac catheterization laboratory for intervention to the RCA and relook at  the left circumflex system to see if any additional intervention was necessary. The re-look cath was also performed by Dr. Tresa Endo. It demonstrated the newly placed circumflex stent to be widely patent. The RCA was successfully treated with a DES. The patient left the cath lab in stable condition. There were no post-operative complications. He had no further chest pain or shortness of breath. Both the right and left groin sites remained stable. He was placed on DAPT with ASA and Brilinta, as well as a low dose beta blocker, an ACE-I and statin. He was ordered to continue amiodarone therapy post discharge. On hospital day 4, he was seen and examined by Dr. Herbie Baltimore, who felt that he was stable for discharge home. He will follow-up at [facility] on 04/09/13.    Consults: None  Significant Diagnostic Studies:    Emergent LHC W/ PCI 03/29/13 IMPRESSION:  1. Acute inferior ST-segment elevation myocardial infarction secondary  to total occlusion of the left circumflex coronary artery  proximally.  2. Normal left anterior descending coronary artery.  3. 20% proximal right coronary artery narrowing with 85% to 90% mid  smooth right coronary artery narrowing.  4. 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  5. Moderate global hypokinesis with ejection fraction of approximately  40%, with focal mid anterolateral and mid distal inferior  hypocontractility.  6. Successful percutaneous coronary intervention to the total occluded  circumflex with percutaneous transluminal coronary  angioplasty/thrombectomy/stenting of the proximal circumflex with  100% occlusion being reduced to 0% with ultimate insertion of a 3.0  x 15 mm Xience Xpedition stent, post dilated 3.3 mm, and evidence  for percutaneous transluminal coronary angioplasty of the distal  circumflex  bifurcation with 90% being reduced to less than 30%, but  evidence for residual bifurcation stenosis in the obtuse marginal 1  and mid atrioventricular groove circumflex  of 50% to 60%, with TIMI-  3 flow.   Re-look Cath 03/31/13 Ao: 121/80  Relook of LCX: widely patent proximal stent without restosis of distal LXx PTCA site and improvement in bifurcation OM1 and Mid AV groove ostial lesions to 40%  Successful PCI with 2.5 mm x 15 Angiosculpt, 3.25 x 18 Xiehnce Xpedition DES stent post dilated with 3.5 x 15 [place] Trek from 80 - 85% to 0 and PTCA via stent strut to RV Marginal ostial stenosis of 95% with 2.0 x 12 Sprinter balloon to <40%.   Treatments: See Hospital Course  Discharge Exam: Blood pressure 110/72, pulse 73, temperature 98.6 F (37 C), temperature source Oral, resp. rate 18, height 5\' 10"  (1.778 m), weight 217 lb 14.4 oz (98.839 kg), SpO2 97.00%.   Disposition: 01-Home or Self Care  Discharge Orders   Future Orders Complete By Expires     Amb Referral to Cardiac Rehabilitation  As directed     Diet - low sodium heart healthy  As directed     Diet - low sodium heart healthy  As directed     Driving Restrictions  As directed     Comments:      No driving until follow-up appointment    Driving Restrictions  As directed     Comments:      No driving until follow-up    Increase activity slowly  As directed     Increase activity slowly  As directed     Lifting restrictions  As directed     Comments:      No lifting more than 1/2 gallon of milk for 7 days    Lifting restrictions  As directed     Comments:      No heavy lifting more than 1/2 gallon of milk for 1 week        Medication List    TAKE these medications       amiodarone 200 MG tablet  Commonly known as:  PACERONE  Take 1 tablet (200 mg total) by mouth 2 (two) times daily.     aspirin 81 MG EC tablet  Take 1 tablet (81 mg total) by mouth daily.     atorvastatin 80 MG tablet  Commonly known as:  LIPITOR  Take 1 tablet (80 mg total) by mouth daily at 6 PM.     ibuprofen 200 MG tablet  Commonly known as:  ADVIL,MOTRIN  Take 400 mg by mouth daily as needed for pain.      lisinopril 10 MG tablet  Commonly known as:  PRINIVIL,ZESTRIL  Take 1 tablet (10 mg total) by mouth daily.     magnesium oxide 400 (241.3 MG) MG tablet  Commonly known as:  MAG-OX  Take 1 tablet (400 mg total) by mouth 2 (two) times daily.     metoprolol 50 MG tablet  Commonly known as:  LOPRESSOR  Take 1 tablet (50 mg total) by mouth 2 (two) times daily.     Ticagrelor 90 MG Tabs tablet  Commonly known as:  BRILINTA  Take 1 tablet (90 mg total) by mouth 2 (two) times daily.     Ticagrelor 90 MG Tabs tablet  Commonly known as:  BRILINTA  Take 1 tablet (90 mg total) by mouth 2 (two) times daily.  Follow-up Information   Follow up with HAGER, BRYAN, PA-C On 04/09/2013. (2:40 pm)    Contact information:   [address] [place] [address] [phone]      TIME SPENT ON DISCHARGE SUMMARY, INCLUDING PHYSICIAN TIME: > 30 MINUTES  Signed: Allayne Butcher, PA-C 04/03/2013, 2:27 PM

## 2013-04-05 ENCOUNTER — Inpatient Hospital Stay (HOSPITAL_COMMUNITY)
Admission: EM | Admit: 2013-04-05 | Discharge: 2013-04-08 | DRG: 278 | Disposition: A | Discharge status: Home / Self Care | Payer: BC Managed Care – PPO | Attending: Internal Medicine | Admitting: Internal Medicine

## 2013-04-05 ENCOUNTER — Encounter (HOSPITAL_COMMUNITY): Payer: Self-pay | Admitting: *Deleted

## 2013-04-05 ENCOUNTER — Inpatient Hospital Stay (HOSPITAL_COMMUNITY): Payer: BC Managed Care – PPO

## 2013-04-05 DIAGNOSIS — I469 Cardiac arrest, cause unspecified: Secondary | ICD-10-CM

## 2013-04-05 DIAGNOSIS — I2510 Atherosclerotic heart disease of native coronary artery without angina pectoris: Secondary | ICD-10-CM | POA: Diagnosis present

## 2013-04-05 DIAGNOSIS — D72829 Elevated white blood cell count, unspecified: Secondary | ICD-10-CM

## 2013-04-05 DIAGNOSIS — I213 ST elevation (STEMI) myocardial infarction of unspecified site: Secondary | ICD-10-CM

## 2013-04-05 DIAGNOSIS — Z955 Presence of coronary angioplasty implant and graft: Secondary | ICD-10-CM

## 2013-04-05 DIAGNOSIS — T50905A Adverse effect of unspecified drugs, medicaments and biological substances, initial encounter: Secondary | ICD-10-CM

## 2013-04-05 DIAGNOSIS — L0291 Cutaneous abscess, unspecified: Secondary | ICD-10-CM

## 2013-04-05 DIAGNOSIS — L0390 Cellulitis, unspecified: Secondary | ICD-10-CM | POA: Diagnosis present

## 2013-04-05 DIAGNOSIS — Z9861 Coronary angioplasty status: Secondary | ICD-10-CM

## 2013-04-05 DIAGNOSIS — L02219 Cutaneous abscess of trunk, unspecified: Principal | ICD-10-CM | POA: Diagnosis present

## 2013-04-05 DIAGNOSIS — Z7982 Long term (current) use of aspirin: Secondary | ICD-10-CM

## 2013-04-05 DIAGNOSIS — Z95820 Peripheral vascular angioplasty status with implants and grafts: Secondary | ICD-10-CM

## 2013-04-05 DIAGNOSIS — L270 Generalized skin eruption due to drugs and medicaments taken internally: Secondary | ICD-10-CM | POA: Diagnosis present

## 2013-04-05 DIAGNOSIS — Z87891 Personal history of nicotine dependence: Secondary | ICD-10-CM

## 2013-04-05 DIAGNOSIS — Z79899 Other long term (current) drug therapy: Secondary | ICD-10-CM

## 2013-04-05 DIAGNOSIS — I959 Hypotension, unspecified: Secondary | ICD-10-CM

## 2013-04-05 DIAGNOSIS — L03319 Cellulitis of trunk, unspecified: Principal | ICD-10-CM | POA: Diagnosis present

## 2013-04-05 DIAGNOSIS — L251 Unspecified contact dermatitis due to drugs in contact with skin: Secondary | ICD-10-CM | POA: Diagnosis present

## 2013-04-05 DIAGNOSIS — T46905A Adverse effect of unspecified agents primarily affecting the cardiovascular system, initial encounter: Secondary | ICD-10-CM | POA: Diagnosis present

## 2013-04-05 LAB — URINE MICROSCOPIC-ADD ON

## 2013-04-05 LAB — HEPATIC FUNCTION PANEL
Albumin: 3.4 g/dL — ABNORMAL LOW (ref 3.5–5.2)
Alkaline Phosphatase: 67 U/L (ref 39–117)
Bilirubin, Direct: 0.3 mg/dL (ref 0.0–0.3)
Indirect Bilirubin: 0.5 mg/dL (ref 0.3–0.9)
Total Bilirubin: 0.8 mg/dL (ref 0.3–1.2)

## 2013-04-05 LAB — CBC
Platelets: 194 10*3/uL (ref 150–400)
RDW: 12.5 % (ref 11.5–15.5)
WBC: 17.8 10*3/uL — ABNORMAL HIGH (ref 4.0–10.5)

## 2013-04-05 LAB — URINALYSIS, ROUTINE W REFLEX MICROSCOPIC
Glucose, UA: NEGATIVE mg/dL
Leukocytes, UA: NEGATIVE
Nitrite: NEGATIVE
Protein, ur: NEGATIVE mg/dL

## 2013-04-05 LAB — CBC WITH DIFFERENTIAL/PLATELET
Basophils Absolute: 0.1 10*3/uL (ref 0.0–0.1)
HCT: 41.5 % (ref 39.0–52.0)
Hemoglobin: 15.1 g/dL (ref 13.0–17.0)
Lymphocytes Relative: 4 % — ABNORMAL LOW (ref 12–46)
Lymphs Abs: 0.7 10*3/uL (ref 0.7–4.0)
Monocytes Absolute: 0.8 10*3/uL (ref 0.1–1.0)
Monocytes Relative: 5 % (ref 3–12)
Neutro Abs: 15.2 10*3/uL — ABNORMAL HIGH (ref 1.7–7.7)
WBC: 16.8 10*3/uL — ABNORMAL HIGH (ref 4.0–10.5)

## 2013-04-05 LAB — BASIC METABOLIC PANEL
BUN: 14 mg/dL (ref 6–23)
CO2: 21 mEq/L (ref 19–32)
Chloride: 96 mEq/L (ref 96–112)
Chloride: 101 mEq/L (ref 96–112)
Creatinine, Ser: 1.34 mg/dL (ref 0.50–1.35)
Creatinine, Ser: 1.21 mg/dL (ref 0.50–1.35)
GFR calc Af Amer: 82 mL/min — ABNORMAL LOW (ref >90)
GFR calc non Af Amer: 71 mL/min — ABNORMAL LOW (ref >90)
Potassium: 4.3 mEq/L (ref 3.5–5.1)

## 2013-04-05 MED ORDER — METOPROLOL TARTRATE 50 MG PO TABS
50.0000 mg | ORAL_TABLET | Freq: Two times a day (BID) | ORAL | Status: DC
  Filled 2013-04-05 (×3): qty 1

## 2013-04-05 MED ORDER — CARVEDILOL 12.5 MG PO TABS
12.5000 mg | ORAL_TABLET | Freq: Two times a day (BID) | ORAL | Status: DC
  Administered 2013-04-06 – 2013-04-08 (×5): 12.5 mg via ORAL
  Filled 2013-04-05 (×7): qty 1

## 2013-04-05 MED ORDER — ACETAMINOPHEN 325 MG PO TABS
650.0000 mg | ORAL_TABLET | Freq: Four times a day (QID) | ORAL | Status: DC | PRN
  Administered 2013-04-05 (×2): 650 mg via ORAL
  Filled 2013-04-05 (×2): qty 2

## 2013-04-05 MED ORDER — LISINOPRIL 10 MG PO TABS
10.0000 mg | ORAL_TABLET | Freq: Every day | ORAL | Status: DC
  Filled 2013-04-05: qty 1

## 2013-04-05 MED ORDER — PREDNISONE 20 MG PO TABS
60.0000 mg | ORAL_TABLET | ORAL | Status: AC
  Administered 2013-04-05: 60 mg via ORAL
  Filled 2013-04-05: qty 3

## 2013-04-05 MED ORDER — ATORVASTATIN CALCIUM 80 MG PO TABS
80.0000 mg | ORAL_TABLET | Freq: Every day | ORAL | Status: DC
  Administered 2013-04-05 – 2013-04-07 (×3): 80 mg via ORAL
  Filled 2013-04-05 (×4): qty 1

## 2013-04-05 MED ORDER — CLINDAMYCIN PHOSPHATE 600 MG/50ML IV SOLN
600.0000 mg | Freq: Three times a day (TID) | INTRAVENOUS | Status: DC
  Administered 2013-04-05 – 2013-04-06 (×4): 600 mg via INTRAVENOUS
  Filled 2013-04-05 (×5): qty 50

## 2013-04-05 MED ORDER — TICAGRELOR 90 MG PO TABS
90.0000 mg | ORAL_TABLET | Freq: Two times a day (BID) | ORAL | Status: DC
  Administered 2013-04-05 – 2013-04-07 (×5): 90 mg via ORAL
  Filled 2013-04-05 (×6): qty 1

## 2013-04-05 MED ORDER — CARVEDILOL 6.25 MG PO TABS
6.2500 mg | ORAL_TABLET | Freq: Two times a day (BID) | ORAL | Status: DC
  Administered 2013-04-05: 6.25 mg via ORAL
  Filled 2013-04-05 (×2): qty 1

## 2013-04-05 MED ORDER — DIPHENHYDRAMINE HCL 25 MG PO CAPS
25.0000 mg | ORAL_CAPSULE | Freq: Once | ORAL | Status: AC
  Administered 2013-04-05: 25 mg via ORAL
  Filled 2013-04-05: qty 1

## 2013-04-05 MED ORDER — DIPHENHYDRAMINE HCL 25 MG PO CAPS
25.0000 mg | ORAL_CAPSULE | Freq: Four times a day (QID) | ORAL | Status: DC | PRN
  Administered 2013-04-05: 25 mg via ORAL
  Filled 2013-04-05: qty 1

## 2013-04-05 MED ORDER — MAGNESIUM OXIDE 400 (241.3 MG) MG PO TABS
400.0000 mg | ORAL_TABLET | Freq: Two times a day (BID) | ORAL | Status: DC
  Administered 2013-04-05 – 2013-04-08 (×7): 400 mg via ORAL
  Filled 2013-04-05 (×8): qty 1

## 2013-04-05 MED ORDER — HEPARIN SODIUM (PORCINE) 5000 UNIT/ML IJ SOLN
5000.0000 [IU] | Freq: Three times a day (TID) | INTRAMUSCULAR | Status: DC
  Administered 2013-04-05 – 2013-04-08 (×10): 5000 [IU] via SUBCUTANEOUS
  Filled 2013-04-05 (×13): qty 1

## 2013-04-05 MED ORDER — CLINDAMYCIN PHOSPHATE 900 MG/50ML IV SOLN
900.0000 mg | Freq: Once | INTRAVENOUS | Status: AC
  Administered 2013-04-05: 900 mg via INTRAVENOUS
  Filled 2013-04-05: qty 50

## 2013-04-05 MED ORDER — FAMOTIDINE 20 MG PO TABS
20.0000 mg | ORAL_TABLET | Freq: Once | ORAL | Status: AC
  Administered 2013-04-05: 20 mg via ORAL
  Filled 2013-04-05: qty 1

## 2013-04-05 MED ORDER — ACETAMINOPHEN 500 MG PO TABS
1000.0000 mg | ORAL_TABLET | Freq: Once | ORAL | Status: AC
  Administered 2013-04-05: 1000 mg via ORAL
  Filled 2013-04-05: qty 2

## 2013-04-05 MED ORDER — ASPIRIN EC 81 MG PO TBEC
81.0000 mg | DELAYED_RELEASE_TABLET | Freq: Every day | ORAL | Status: DC
  Administered 2013-04-05 – 2013-04-08 (×4): 81 mg via ORAL
  Filled 2013-04-05 (×4): qty 1

## 2013-04-05 MED ORDER — IBUPROFEN 400 MG PO TABS
400.0000 mg | ORAL_TABLET | Freq: Every day | ORAL | Status: DC | PRN
  Filled 2013-04-05: qty 1

## 2013-04-05 MED ORDER — SODIUM CHLORIDE 0.9 % IV BOLUS (SEPSIS)
1000.0000 mL | Freq: Once | INTRAVENOUS | Status: AC
  Administered 2013-04-05: 1000 mL via INTRAVENOUS

## 2013-04-05 MED ORDER — AMIODARONE HCL 200 MG PO TABS
200.0000 mg | ORAL_TABLET | Freq: Two times a day (BID) | ORAL | Status: DC
  Administered 2013-04-05 – 2013-04-08 (×7): 200 mg via ORAL
  Filled 2013-04-05 (×8): qty 1

## 2013-04-05 MED ORDER — PREDNISONE 50 MG PO TABS
60.0000 mg | ORAL_TABLET | Freq: Every day | ORAL | Status: DC
  Administered 2013-04-05 – 2013-04-08 (×4): 60 mg via ORAL
  Filled 2013-04-05 (×5): qty 1

## 2013-04-05 MED ORDER — ACETAMINOPHEN 325 MG PO TABS
650.0000 mg | ORAL_TABLET | ORAL | Status: DC | PRN
  Administered 2013-04-05 – 2013-04-06 (×2): 650 mg via ORAL
  Filled 2013-04-05 (×2): qty 2

## 2013-04-05 NOTE — Progress Notes (Signed)
Elray Mcgregor NP notified of patients temp 101 and also earlier elevations. Frequency of tylenol increased to q 4 hrs prn.

## 2013-04-05 NOTE — Progress Notes (Addendum)
Patient admitted early this AM-  Appears very much like an allergic rxn +/- cellulitis---hives on face, back, chest, will leave on abx, give steroids, benadryl, ask pharmacy to review new medications for most likely cause/interactions between.  BC x2 due to fever Any signs of SOB, stridor will required immediate transfer to ICU and CC consult  Spoke with family who thinks they have been giving patient the double dose of lisinopril and Lipitor.  Stopped lisinopril and change metoprolol to coreg  Jessica vann

## 2013-04-05 NOTE — ED Notes (Signed)
Meds given to wife to take home.

## 2013-04-05 NOTE — H&P (Signed)
[facility] History and Physical  Satish Hammers ZOX:[id number] DOB: [birth year] DOA: 04/05/2013  Referring physician: ED PCP: Gabriel Cirri DO  Specialists: [facility]  Chief Complaint: Skin Rash  HPI: Ricky Roberts is a 47 y.o. male who was just discharged from the hospital following cardiac arrest due to STEMI.  The patient notes that over the past few days and in particular today he has had increasing erythema throughout the torso as well as increasing puritis.  The patient notes that he has been on several new meds since discharge, and that cardiac pacer pads were placed during his stay (required defib x4).  There are no clear alleviating or exacerbating factors, hydrocortisone cream does not seem to help his wife states.  In the ED patient was noted to have fever of 100.6, WBC of 16.8.  He was initially treated with benadryl and steroids, but later started on clindamycin IV when it was felt that the rash likely represents a cellulitis.  Review of Systems: 12 systems reviewed and otherwise negative.  Past Medical History  Diagnosis Date  . Cardiac arrest, secondary to torsodes with 4 shocks by EMS  03/31/2013  . CAD (coronary artery disease), residual RCA stenosis with recurrent angina 03/31/2013  . S/P angioplasty with stent 03/31/13 to RV ostial marginal with DES Xience Xpedition stent 04/01/2013   Past Surgical History  Procedure Laterality Date  . Fracture surgery      both ankles as a child  . Coronary stent placement  4/27-28/2013    DES to LCx (STEMI culprit); staged PCI to RCA for post STEMI Angina   Social History:  reports that he quit smoking about 13 months ago. His smoking use included Cigarettes. He smoked 0.00 packs per day for 23 years. He does not have any smokeless tobacco history on file. He reports that he drinks about 3.0 ounces of alcohol per week. He reports that he uses illicit drugs (Other-see comments).   No Known Allergies  No family history on file.  Non-contributory  Prior to Admission medications   Medication Sig Start Date End Date Taking? Authorizing Provider  amiodarone (PACERONE) 200 MG tablet Take 1 tablet (200 mg total) by mouth 2 (two) times daily. 04/02/13  Yes Brittainy Sharol Harness, PA-C  aspirin EC 81 MG EC tablet Take 1 tablet (81 mg total) by mouth daily. 04/02/13  Yes Brittainy Simmons, PA-C  atorvastatin (LIPITOR) 80 MG tablet Take 1 tablet (80 mg total) by mouth daily at 6 PM. 04/02/13  Yes Brittainy Simmons, PA-C  ibuprofen (ADVIL,MOTRIN) 200 MG tablet Take 400 mg by mouth daily as needed for pain.   Yes Historical Provider, MD  lisinopril (PRINIVIL,ZESTRIL) 10 MG tablet Take 1 tablet (10 mg total) by mouth daily. 04/02/13  Yes Brittainy Simmons, PA-C  magnesium oxide (MAG-OX) 400 (241.3 MG) MG tablet Take 1 tablet (400 mg total) by mouth 2 (two) times daily. 04/02/13  Yes Brittainy Simmons, PA-C  metoprolol (LOPRESSOR) 50 MG tablet Take 1 tablet (50 mg total) by mouth 2 (two) times daily. 04/02/13  Yes Brittainy Sharol Harness, PA-C  Ticagrelor (BRILINTA) 90 MG TABS tablet Take 1 tablet (90 mg total) by mouth 2 (two) times daily. 04/02/13  Yes Robbie Lis, PA-C   Physical Exam: Filed Vitals:   04/05/13 0039 04/05/13 0045 04/05/13 0100  BP: 114/58 109/55 116/58  Pulse: 93 96 94  Temp: 100.6 F (38.1 C)    TempSrc: Oral    Resp: 20    SpO2: 97% 97% 96%    General:  NAD, resting comfortably in bed Eyes: PEERLA EOMI ENT: mucous membranes moist Neck: supple w/o JVD Cardiovascular: RRR w/o MRG Respiratory: CTA B Abdomen: soft, nt, nd, bs+ Skin: diffuse rash on torso and back, rash is most confluent around the areas where the defibrillator pads were placed (has easily identifiable areas of complete erythema where these pads were placed on chest, side and back).  The papular lesions become less confluent as they spread away from these areas on the torso. Musculoskeletal: MAE, full ROM all 4 extremities Psychiatric: normal tone  and affect Neurologic: AAOx3, grossly non-focal   Labs on Admission:  Basic Metabolic Panel:  Recent Labs Lab 03/29/13 2129 03/29/13 2350 03/30/13 0505 03/31/13 0300 04/01/13 0503 04/02/13 0924 04/05/13 0057  NA 140  --  137 139 137 138 129*  K 3.4*  --  4.3 3.9 3.4* 3.4* 4.1  CL 106  --  104 105 103 103 96  CO2  --   --  22 26 26 24 21   GLUCOSE 149*  --  133* 107* 104* 110* 112*  BUN 14  --  11 8 9 14 14   CREATININE 1.20  --  0.89 1.07 1.05 1.04 1.34  CALCIUM  --   --  8.2* 8.8 8.7 9.7 9.3  MG  --  1.7  --   --  2.1  --   --    Liver Function Tests:  Recent Labs Lab 03/29/13 2125  AST 48*  ALT 46  ALKPHOS 50  BILITOT 0.4  PROT 7.7  ALBUMIN 4.3   No results found for this basename: LIPASE, AMYLASE,  in the last 168 hours No results found for this basename: AMMONIA,  in the last 168 hours CBC:  Recent Labs Lab 03/30/13 0505 03/31/13 0300 04/01/13 0503 04/02/13 0525 04/05/13 0057  WBC 12.4* 12.8* 9.3 10.0 16.8*  NEUTROABS  --   --   --   --  15.2*  HGB 14.4 14.3 13.1 15.0 15.1  HCT 40.2 39.7 37.8* 41.7 41.5  MCV 84.3 85.2 86.5 85.5 85.2  PLT 170 147* 129* 163 192   Cardiac Enzymes:  Recent Labs Lab 03/30/13 0844 03/30/13 1306 03/30/13 1745  TROPONINI >20.00* >20.00* >20.00*    BNP (last 3 results) No results found for this basename: PROBNP,  in the last 8760 hours CBG: No results found for this basename: GLUCAP,  in the last 168 hours  Radiological Exams on Admission: No results found.  EKG: Independently reviewed.  Assessment/Plan Principal Problem:   Cellulitis Active Problems:   S/P coronary artery stent placement, LCX, emergently 03/29/13   S/P angioplasty with stent 03/31/13 to RV ostial marginal with DES Xience Xpedition stent   1. Rash - given the clinical scenario, the most likely diagnosis in my opinion is that the patient initially had an allergic reaction to the adhesive on the defibrillator pads the skin breakdown from which  served as a nidus for entry of a bacterial infection.  The confluent area where the pads were placed is highly suggestive of this, the elevated WBC and the differential being neutrophils is more suggestive of a bacterial infection than an eosinophilic type 1 hypersensitivity reaction.  However the DDX certainly doesincludes - allergy to one of the 7 new medications started at discharge.  Will continue the patient on clindamycin for now, admit to inpatient, if patient does not improve then will either need to switch antibiotics, or consider allergic reaction to one of the 7 new meds. 2. Will continue  the cardiac meds for present time. 3. Will avoid using adhesive unless absolutely needed so will hold off on putting patient on a tele monitor for now (discussed this with patient).    Code Status: Full Code (must indicate code status--if unknown or must be presumed, indicate so) Family Communication: Spoke with wife in ED, all questions answered (indicate person spoken with, if applicable, with phone number if by telephone) Disposition Plan: Admit to inpatient (indicate anticipated LOS)  Time spent: 70 min  GARDNER, JARED M. [facility] Pager [phone]  If 7PM-7AM, please contact night-coverage [email or web address] Password [facility] 04/05/2013, 2:48 AM

## 2013-04-05 NOTE — ED Notes (Addendum)
Thursday / Friday morning - allergic reaction - noticed it around face, now has it on chest and upper arms. No airway compromise. pcp told pt. To put cortisone on the rash. No improvement. No change in medications or change in environment. Pt. D/c Wednesday for heart attack. The redness is coming from where he had the zoll pads and ecg lead patches. Noticed one spot when in hospital.

## 2013-04-05 NOTE — ED Provider Notes (Signed)
History     CSN: [id number]  Arrival date & time 04/05/13  0027   First MD Initiated Contact with Patient 04/05/13 0031      Chief Complaint  Patient presents with  . Allergic Reaction   HPI  Patient presents after recent discharge following cardiac arrest, now with concerns of rash. He states over the past days, and in particular today there has been increasing erythema throughout the torso, as well as increasing pruritus. No new nausea, vomiting, chest pain, dyspnea. There was mild pain associated with the diffuse rash, but this is now gone. The patient recalls having cardiac pacer pads placed, and initiating new medication on discharge. Since the rash began to have been no clear alleviating or exacerbating factors.   Past Medical History  Diagnosis Date  . Cardiac arrest, secondary to torsodes with 4 shocks by EMS  03/31/2013  . CAD (coronary artery disease), residual RCA stenosis with recurrent angina 03/31/2013  . S/P angioplasty with stent 03/31/13 to RV ostial marginal with DES Xience Xpedition stent 04/01/2013    Past Surgical History  Procedure Laterality Date  . Fracture surgery      both ankles as a child  . Coronary stent placement  4/27-28/2013    DES to LCx (STEMI culprit); staged PCI to RCA for post STEMI Angina    No family history on file.  History  Substance Use Topics  . Smoking status: Former Smoker -- 23 years    Types: Cigarettes    Quit date: 03/04/2012  . Smokeless tobacco: Not on file  . Alcohol Use: 3.0 oz/week    5 Cans of beer per week     Comment: 5-6 cans of beer daily      Review of Systems  All other systems reviewed and are negative.    Allergies  Review of patient's allergies indicates no known allergies.  Home Medications   Current Outpatient Rx  Name  Route  Sig  Dispense  Refill  . amiodarone (PACERONE) 200 MG tablet   Oral   Take 1 tablet (200 mg total) by mouth 2 (two) times daily.   60 tablet   5   . aspirin EC  81 MG EC tablet   Oral   Take 1 tablet (81 mg total) by mouth daily.         Marland Kitchen atorvastatin (LIPITOR) 80 MG tablet   Oral   Take 1 tablet (80 mg total) by mouth daily at 6 PM.   30 tablet   5   . ibuprofen (ADVIL,MOTRIN) 200 MG tablet   Oral   Take 400 mg by mouth daily as needed for pain.         Marland Kitchen lisinopril (PRINIVIL,ZESTRIL) 10 MG tablet   Oral   Take 1 tablet (10 mg total) by mouth daily.   30 tablet   5   . magnesium oxide (MAG-OX) 400 (241.3 MG) MG tablet   Oral   Take 1 tablet (400 mg total) by mouth 2 (two) times daily.   60 tablet   5   . metoprolol (LOPRESSOR) 50 MG tablet   Oral   Take 1 tablet (50 mg total) by mouth 2 (two) times daily.   60 tablet   5   . Ticagrelor (BRILINTA) 90 MG TABS tablet   Oral   Take 1 tablet (90 mg total) by mouth 2 (two) times daily.   60 tablet   10   . Ticagrelor (BRILINTA) 90 MG TABS tablet  Oral   Take 1 tablet (90 mg total) by mouth 2 (two) times daily.   60 tablet   0     This is for free 30 day supply     BP 116/58  Pulse 94  Temp(Src) 100.6 F (38.1 C) (Oral)  Resp 20  SpO2 96%  Physical Exam  Nursing note and vitals reviewed. Constitutional: He is oriented to person, place, and time. He appears well-developed and well-nourished. No distress.  HENT:  Head: Normocephalic and atraumatic.  Eyes: Conjunctivae are normal. Right eye exhibits no discharge. Left eye exhibits no discharge.  Neck: No tracheal deviation present. No thyromegaly present.  Cardiovascular: Regular rhythm.  Tachycardia present.   No murmur heard. Pulmonary/Chest: Effort normal. No stridor. No respiratory distress.  Abdominal: Soft. He exhibits no distension.  Musculoskeletal: He exhibits no edema and no tenderness.  Neurological: He is oriented to person, place, and time. No cranial nerve deficit.  Skin: He is not diaphoretic.  There is diffuse rash, largely about the torso, of upper extremities proximally.  The focal areas of  the rash are concurrent with prior cardiac resuscitation meds, where there is contiguous erythematous area..Breasts the torso there is widely dispersed mildly raised papular lesions with confluent erythema.     ED Course  Procedures (including critical care time)  Labs Reviewed  CBC WITH DIFFERENTIAL - Abnormal; Notable for the following:    WBC 16.8 (*)    MCHC 36.4 (*)    Neutrophils Relative 90 (*)    Neutro Abs 15.2 (*)    Lymphocytes Relative 4 (*)    All other components within normal limits  BASIC METABOLIC PANEL   No results found.   No diagnosis found.    MDM  The patient presents several days after discharge from a cardiac arrest, now with increasing rash, fever, tachycardia. The patient's airways intact, but with the progression of the symptoms are some concern for cellulitis from what seems to have been dermatitis initially. The patient received IV fluids, antibiotics, was admitted for further evaluation and management      Gerhard Munch, MD 04/05/13 [phone]

## 2013-04-05 NOTE — Progress Notes (Signed)
Nutrition Brief Note  Patient identified on the Malnutrition Screening Tool (MST) Report  Body mass index is 30.05 kg/(m^2). Patient meets criteria for obese class 1 based on current BMI.   Current diet order is Heart Healthy, patient is consuming approximately 100% of meals at this time. Labs and medications reviewed.   Pt reports losing 5-10 lbs over the past few weeks.  He did recently make some healthy changes to diet.  Pt reports good appetite and intake.  Does c/o stool containing large amount of undigested material.  Wt loss is not clinically significant at this time.  FYI message sent to MD concerning stool.  Encouraged pt and family to address with MD.  No nutrition interventions warranted at this time. If nutrition issues arise, please consult RD.   Loyce Dys, MS RD LDN Clinical Inpatient Dietitian Pager: [phone] Weekend/After hours pager: [phone]

## 2013-04-06 DIAGNOSIS — I469 Cardiac arrest, cause unspecified: Secondary | ICD-10-CM

## 2013-04-06 DIAGNOSIS — T887XXA Unspecified adverse effect of drug or medicament, initial encounter: Secondary | ICD-10-CM

## 2013-04-06 DIAGNOSIS — I959 Hypotension, unspecified: Secondary | ICD-10-CM

## 2013-04-06 DIAGNOSIS — D72829 Elevated white blood cell count, unspecified: Secondary | ICD-10-CM

## 2013-04-06 LAB — BASIC METABOLIC PANEL
CO2: 22 mEq/L (ref 19–32)
Chloride: 100 mEq/L (ref 96–112)
GFR calc Af Amer: 80 mL/min — ABNORMAL LOW (ref >90)
Potassium: 3.9 mEq/L (ref 3.5–5.1)
Sodium: 133 mEq/L — ABNORMAL LOW (ref 135–145)

## 2013-04-06 LAB — CBC
MCV: 83.5 fL (ref 78.0–100.0)
Platelets: 169 10*3/uL (ref 150–400)
RBC: 4.54 MIL/uL (ref 4.22–5.81)
RDW: 12.7 % (ref 11.5–15.5)
WBC: 17.1 10*3/uL — ABNORMAL HIGH (ref 4.0–10.5)

## 2013-04-06 NOTE — Progress Notes (Signed)
[facility] PROGRESS NOTE  Ricky Roberts XBJ:[id number] DOB: 1966/08/30 DOA: 04/05/2013 PCP: Gabriel Cirri, DO  Assessment/Plan: 1. Rash: appears to be an allergic rxn to medications - d/c'd lisinopril- changed metoprolol to coreg- patient did have about an hour of sun exposure on Friday about 4:30 PM.  Continue steroids, benadryl - call cards in aM about change in mediations continue blood thinners . 2. Will avoid using adhesive unless absolutely needed so will hold off on putting patient on a tele monitor for now (discussed this with patient).  3.  Fever; tylenol PRN- ? Rxn to allergies- U/A and chest x ray ok, BC pending- hold off on abx  4. Leukocytosis- steroids  Code Status: full Family Communication: wife at bedside Disposition Plan: home 1-2 days   Consultants:  none  Procedures:  none  Antibiotics:  D/c clinda  HPI/Subjective: Feeling better  Objective: Filed Vitals:   04/05/13 1940 04/05/13 2313 04/06/13 0200 04/06/13 0504  BP: 104/55   100/52  Pulse: 66   108  Temp: 101 F (38.3 C) 100.6 F (38.1 C) 99.4 F (37.4 C) 100.2 F (37.9 C)  TempSrc: Oral Oral Oral Oral  Resp: 20   20  Height:      Weight:    94.303 kg (207 lb 14.4 oz)  SpO2: 98%   93%    Intake/Output Summary (Last 24 hours) at 04/06/13 1041 Last data filed at 04/06/13 0900  Gross per 24 hour  Intake    340 ml  Output   1275 ml  Net   -935 ml   Filed Weights   04/05/13 0419 04/06/13 0504  Weight: 95 kg (209 lb 7 oz) 94.303 kg (207 lb 14.4 oz)    Exam:   General:  A+Ox3, NAD  Cardiovascular: rrr  Respiratory: clear  Abdomen: +BS, soft, NT  Musculoskeletal: moves all 4 ext   Skin: decreasing redness, still covering arms/legs/torso- not on face  Data Reviewed: Basic Metabolic Panel:  Recent Labs Lab 04/01/13 0503 04/02/13 0924 04/05/13 0057 04/05/13 0615 04/06/13 0510  NA 137 138 129* 134* 133*  K 3.4* 3.4* 4.1 4.3 3.9  CL 103 103 96 101 100  CO2 26 24  21 21 22   GLUCOSE 104* 110* 112* 125* 104*  BUN 9 14 14 13 15   CREATININE 1.05 1.04 1.34 1.21 1.24  CALCIUM 8.7 9.7 9.3 9.2 9.1  MG 2.1  --   --   --   --    Liver Function Tests:  Recent Labs Lab 04/05/13 0615  AST 34  ALT 51  ALKPHOS 67  BILITOT 0.8  PROT 7.3  ALBUMIN 3.4*   No results found for this basename: LIPASE, AMYLASE,  in the last 168 hours No results found for this basename: AMMONIA,  in the last 168 hours CBC:  Recent Labs Lab 04/01/13 0503 04/02/13 0525 04/05/13 0057 04/05/13 0615 04/06/13 0510  WBC 9.3 10.0 16.8* 17.8* 17.1*  NEUTROABS  --   --  15.2*  --   --   HGB 13.1 15.0 15.1 14.7 13.6  HCT 37.8* 41.7 41.5 40.9 37.9*  MCV 86.5 85.5 85.2 84.7 83.5  PLT 129* 163 192 194 169   Cardiac Enzymes:  Recent Labs Lab 03/30/13 1306 03/30/13 1745  TROPONINI >20.00* >20.00*   BNP (last 3 results) No results found for this basename: PROBNP,  in the last 8760 hours CBG: No results found for this basename: GLUCAP,  in the last 168 hours  Recent Results (from the past  240 hour(s))  MRSA PCR SCREENING     Status: None   Collection Time    03/29/13 11:29 PM      Result Value Range Status   MRSA by PCR NEGATIVE  NEGATIVE Final   Comment:            The GeneXpert MRSA Assay (FDA     approved for NASAL specimens     only), is one component of a     comprehensive MRSA colonization     surveillance program. It is not     intended to diagnose MRSA     infection nor to guide or     monitor treatment for     MRSA infections.     Studies: Dg Chest 2 View  04/05/2013  *RADIOLOGY REPORT*  Clinical Data: Fever, rash  CHEST - 2 VIEW  Comparison: Prior chest x-ray 03/29/2013  Findings: Near total interval resolution of pulmonary edema compared to prior.  Cardiac and mediastinal contours within normal limits.  There is atherosclerotic calcification in the transverse aorta.  Very mild central vascular congestion.  No pleural effusion or pneumothorax.  No acute  osseous abnormality.  IMPRESSION:  1.  No acute cardiopulmonary abnormality. 2.  Near total interval resolution of pulmonary edema with only minimal residual central vascular congestion compared to 03/29/2013. 3.  The heart is normal in size.   Original Report Authenticated By: Malachy Moan, M.D.     Scheduled Meds: . amiodarone  200 mg Oral BID  . aspirin EC  81 mg Oral Daily  . atorvastatin  80 mg Oral q1800  . carvedilol  12.5 mg Oral BID WC  . clindamycin (CLEOCIN) IV  600 mg Intravenous Q8H  . heparin  5,000 Units Subcutaneous Q8H  . magnesium oxide  400 mg Oral BID  . predniSONE  60 mg Oral Q breakfast  . Ticagrelor  90 mg Oral BID   Continuous Infusions:   Principal Problem:   Cellulitis Active Problems:   S/P coronary artery stent placement, LCX, emergently 03/29/13   S/P angioplasty with stent 03/31/13 to RV ostial marginal with DES Xience Xpedition stent    Time spent: 35    [facility], JESSICA  [facility] Pager [phone]. If 7PM-7AM, please contact night-coverage at [email or web address], password [facility] 04/06/2013, 10:41 AM  LOS: 1 day

## 2013-04-07 DIAGNOSIS — Z9861 Coronary angioplasty status: Secondary | ICD-10-CM

## 2013-04-07 LAB — CBC
HCT: 36.8 % — ABNORMAL LOW (ref 39.0–52.0)
Hemoglobin: 13.2 g/dL (ref 13.0–17.0)
MCV: 83.1 fL (ref 78.0–100.0)
RBC: 4.43 MIL/uL (ref 4.22–5.81)
RDW: 12.5 % (ref 11.5–15.5)
WBC: 11.7 10*3/uL — ABNORMAL HIGH (ref 4.0–10.5)

## 2013-04-07 LAB — BASIC METABOLIC PANEL
BUN: 15 mg/dL (ref 6–23)
Chloride: 101 mEq/L (ref 96–112)
Glucose, Bld: 94 mg/dL (ref 70–99)
Potassium: 3.8 mEq/L (ref 3.5–5.1)

## 2013-04-07 MED ORDER — PRASUGREL HCL 10 MG PO TABS
10.0000 mg | ORAL_TABLET | Freq: Every day | ORAL | Status: DC
  Administered 2013-04-08: 10 mg via ORAL
  Filled 2013-04-07 (×2): qty 1

## 2013-04-07 NOTE — Progress Notes (Signed)
[facility] PROGRESS NOTE  Meliton Samad ZOX:[id number] DOB: 04-05-1966 DOA: 04/05/2013 PCP: Gabriel Cirri, DO  Assessment/Plan: 1. Rash: appears to be an allergic rxn to medications - d/c'd lisinopril- changed metoprolol to coreg- patient did have about an hour of sun exposure on Friday about 4:30 PM.  Continue steroids, benadryl - consult cards about change in mediations continue blood thinners- improving some but not resolved yet . 2. Will avoid using adhesive unless absolutely needed so will hold off on putting patient on a tele monitor for now (discussed this with patient).  3.  Fever; tylenol PRN- ? Rxn to allergies- U/A and chest x ray ok, BC pending- hold off on abx- no fever x 24 hours  4. Leukocytosis- steroids- decreasing  Code Status: full Family Communication: wife at bedside Disposition Plan: home 1-2 days   Consultants:  none  Procedures:  none  Antibiotics:  D/c clinda  HPI/Subjective: Feeling better Less itching No CP  Objective: Filed Vitals:   04/06/13 1345 04/06/13 1715 04/06/13 2005 04/07/13 0455  BP: 114/66 135/74 123/61 104/60  Pulse: 83 79 78 62  Temp: 97.7 F (36.5 C)  97.9 F (36.6 C) 98.4 F (36.9 C)  TempSrc: Oral  Oral Oral  Resp: 19  20 20   Height:      Weight:      SpO2: 93% 100% 97% 96%    Intake/Output Summary (Last 24 hours) at 04/07/13 1029 Last data filed at 04/07/13 8119  Gross per 24 hour  Intake    120 ml  Output   2325 ml  Net  -2205 ml   Filed Weights   04/05/13 0419 04/06/13 0504  Weight: 95 kg (209 lb 7 oz) 94.303 kg (207 lb 14.4 oz)    Exam:   General:  A+Ox3, NAD  Cardiovascular: rrr  Respiratory: clear  Abdomen: +BS, soft, NT  Musculoskeletal: moves all 4 ext   Skin: decreasing redness, still covering arms/legs/torso- not on face  Data Reviewed: Basic Metabolic Panel:  Recent Labs Lab 04/01/13 0503 04/02/13 0924 04/05/13 0057 04/05/13 0615 04/06/13 0510 04/07/13 0440  NA 137 138  129* 134* 133* 137  K 3.4* 3.4* 4.1 4.3 3.9 3.8  CL 103 103 96 101 100 101  CO2 26 24 21 21 22 26   GLUCOSE 104* 110* 112* 125* 104* 94  BUN 9 14 14 13 15 15   CREATININE 1.05 1.04 1.34 1.21 1.24 1.11  CALCIUM 8.7 9.7 9.3 9.2 9.1 9.2  MG 2.1  --   --   --   --   --    Liver Function Tests:  Recent Labs Lab 04/05/13 0615  AST 34  ALT 51  ALKPHOS 67  BILITOT 0.8  PROT 7.3  ALBUMIN 3.4*   No results found for this basename: LIPASE, AMYLASE,  in the last 168 hours No results found for this basename: AMMONIA,  in the last 168 hours CBC:  Recent Labs Lab 04/02/13 0525 04/05/13 0057 04/05/13 0615 04/06/13 0510 04/07/13 0440  WBC 10.0 16.8* 17.8* 17.1* 11.7*  NEUTROABS  --  15.2*  --   --   --   HGB 15.0 15.1 14.7 13.6 13.2  HCT 41.7 41.5 40.9 37.9* 36.8*  MCV 85.5 85.2 84.7 83.5 83.1  PLT 163 192 194 169 176   Cardiac Enzymes: No results found for this basename: CKTOTAL, CKMB, CKMBINDEX, TROPONINI,  in the last 168 hours BNP (last 3 results) No results found for this basename: PROBNP,  in the last 8760 hours  CBG: No results found for this basename: GLUCAP,  in the last 168 hours  Recent Results (from the past 240 hour(s))  MRSA PCR SCREENING     Status: None   Collection Time    03/29/13 11:29 PM      Result Value Range Status   MRSA by PCR NEGATIVE  NEGATIVE Final   Comment:            The GeneXpert MRSA Assay (FDA     approved for NASAL specimens     only), is one component of a     comprehensive MRSA colonization     surveillance program. It is not     intended to diagnose MRSA     infection nor to guide or     monitor treatment for     MRSA infections.  CULTURE, BLOOD (ROUTINE X 2)     Status: None   Collection Time    04/05/13  8:45 AM      Result Value Range Status   Specimen Description BLOOD LEFT HAND   Final   Special Requests BOTTLES DRAWN AEROBIC ONLY 1.5CC   Final   Culture  Setup Time 04/05/2013 18:12   Final   Culture     Final   Value:         BLOOD CULTURE RECEIVED NO GROWTH TO DATE CULTURE WILL BE HELD FOR 5 DAYS BEFORE ISSUING A FINAL NEGATIVE REPORT   Report Status PENDING   Incomplete  CULTURE, BLOOD (ROUTINE X 2)     Status: None   Collection Time    04/05/13  8:50 AM      Result Value Range Status   Specimen Description BLOOD RIGHT HAND   Final   Special Requests BOTTLES DRAWN AEROBIC ONLY 3CC   Final   Culture  Setup Time 04/05/2013 18:12   Final   Culture     Final   Value:        BLOOD CULTURE RECEIVED NO GROWTH TO DATE CULTURE WILL BE HELD FOR 5 DAYS BEFORE ISSUING A FINAL NEGATIVE REPORT   Report Status PENDING   Incomplete     Studies: Dg Chest 2 View  04/05/2013  *RADIOLOGY REPORT*  Clinical Data: Fever, rash  CHEST - 2 VIEW  Comparison: Prior chest x-ray 03/29/2013  Findings: Near total interval resolution of pulmonary edema compared to prior.  Cardiac and mediastinal contours within normal limits.  There is atherosclerotic calcification in the transverse aorta.  Very mild central vascular congestion.  No pleural effusion or pneumothorax.  No acute osseous abnormality.  IMPRESSION:  1.  No acute cardiopulmonary abnormality. 2.  Near total interval resolution of pulmonary edema with only minimal residual central vascular congestion compared to 03/29/2013. 3.  The heart is normal in size.   Original Report Authenticated By: Malachy Moan, M.D.     Scheduled Meds: . amiodarone  200 mg Oral BID  . aspirin EC  81 mg Oral Daily  . atorvastatin  80 mg Oral q1800  . carvedilol  12.5 mg Oral BID WC  . heparin  5,000 Units Subcutaneous Q8H  . magnesium oxide  400 mg Oral BID  . predniSONE  60 mg Oral Q breakfast  . Ticagrelor  90 mg Oral BID   Continuous Infusions:   Principal Problem:   Cellulitis Active Problems:   S/P coronary artery stent placement, LCX, emergently 03/29/13   S/P angioplasty with stent 03/31/13 to RV ostial marginal with DES Xience Xpedition stent  Time spent: 35    [facility],  Tye Juarez  [facility] Pager [phone]. If 7PM-7AM, please contact night-coverage at [email or web address], password [facility] 04/07/2013, 10:29 AM  LOS: 2 days

## 2013-04-07 NOTE — Consult Note (Addendum)
Reason for Consult: Medication Adjustment Recommendation After Skin reaction; S/P STEMI Referring Physician: TRH   HPI: The patient is a 47 y/o AAM, recently admitted on 03/29/13 for inferolateral STEMI. S/P emergent DES placement to a totally occluded circumflex, followed by staged PCI and stenting to RCA, also with a DES. Prior to admission, the patient did not take any prescription or OTC medicines. He was discharged home on 5/1 on DAPT, with ASA + Brilinta, Lopressor, Lisinopril, Atorvastatin, and Amiodarone. He presented back to the [facility] ER on 5/3 with complaints of increasing erythema and pruritis throughout the torso, back and arms. He denied throat/lip/tounge swelling or difficulty breathing or swallowing. In the ED, he was noted to have a fever of 100.6 and WBC of 16.8. He was initially treated with benadryl and steroids, but later started on clindamycin IV when it was felt that the rash was likely  cellulitis. Apparently, the rash started as a localized area of erythema along the left flank. Patient reports that that is where the temporary pacer pads were placed. He was admitted by [facility].  Adjustments have been made to his medications. Per pharmacy recommendations, his lisinopril was discharged (pt was also borderline hypotensive), and Lopressor was changed to Coreg. He is no longer on antibiotics. He reports that the rash is moderately improved. The pruritis has subsided but he continues to endorse diffuse erythema on the torso, upper back, bilateral arms and is now spreading to the upper legs.   From a cardiovascular standpoint, he denies any further chest pain or SOB.    Past Medical History  Diagnosis Date  . Cardiac arrest, secondary to torsodes with 4 shocks by EMS  03/31/2013  . CAD (coronary artery disease), residual RCA stenosis with recurrent angina 03/31/2013  . S/P angioplasty with stent 03/31/13 to RV ostial marginal with DES Xience Xpedition stent 04/01/2013    Past Surgical History   Procedure Laterality Date  . Fracture surgery      both ankles as a child  . Coronary stent placement  4/27-28/2013    DES to LCx (STEMI culprit); staged PCI to RCA for post STEMI Angina    No family history on file.  Social History:  reports that he quit smoking about 13 months ago. His smoking use included Cigarettes. He smoked 0.00 packs per day for 23 years. He does not have any smokeless tobacco history on file. He reports that he drinks about 3.0 ounces of alcohol per week. He reports that he uses illicit drugs (Other-see comments).  Allergies:  Allergies  Allergen Reactions  . Lisinopril Rash    rash    Medications:  Prior to Admission:  Prescriptions prior to admission  Medication Sig Dispense Refill  . amiodarone (PACERONE) 200 MG tablet Take 1 tablet (200 mg total) by mouth 2 (two) times daily.  60 tablet  5  . aspirin EC 81 MG EC tablet Take 1 tablet (81 mg total) by mouth daily.      Marland Kitchen atorvastatin (LIPITOR) 80 MG tablet Take 1 tablet (80 mg total) by mouth daily at 6 PM.  30 tablet  5  . ibuprofen (ADVIL,MOTRIN) 200 MG tablet Take 400 mg by mouth daily as needed for pain.      Marland Kitchen lisinopril (PRINIVIL,ZESTRIL) 10 MG tablet Take 1 tablet (10 mg total) by mouth daily.  30 tablet  5  . magnesium oxide (MAG-OX) 400 (241.3 MG) MG tablet Take 1 tablet (400 mg total) by mouth 2 (two) times daily.  60  tablet  5  . metoprolol (LOPRESSOR) 50 MG tablet Take 1 tablet (50 mg total) by mouth 2 (two) times daily.  60 tablet  5  . Ticagrelor (BRILINTA) 90 MG TABS tablet Take 1 tablet (90 mg total) by mouth 2 (two) times daily.  60 tablet  10    Results for orders placed during the hospital encounter of 04/05/13 (from the past 48 hour(s))  URINALYSIS, ROUTINE W REFLEX MICROSCOPIC     Status: Abnormal   Collection Time    04/05/13  2:41 PM      Result Value Range   Color, Urine YELLOW  YELLOW   APPearance CLEAR  CLEAR   Specific Gravity, Urine 1.014  1.005 - 1.030   pH 5.0  5.0 -  8.0   Glucose, UA NEGATIVE  NEGATIVE mg/dL   Hgb urine dipstick TRACE (*) NEGATIVE   Bilirubin Urine NEGATIVE  NEGATIVE   Ketones, ur NEGATIVE  NEGATIVE mg/dL   Protein, ur NEGATIVE  NEGATIVE mg/dL   Urobilinogen, UA 0.2  0.0 - 1.0 mg/dL   Nitrite NEGATIVE  NEGATIVE   Leukocytes, UA NEGATIVE  NEGATIVE  URINE MICROSCOPIC-ADD ON     Status: None   Collection Time    04/05/13  2:41 PM      Result Value Range   Squamous Epithelial / LPF RARE  RARE   RBC / HPF 0-2  <3 RBC/hpf  CBC     Status: Abnormal   Collection Time    04/06/13  5:10 AM      Result Value Range   WBC 17.1 (*) 4.0 - 10.5 K/uL   RBC 4.54  4.22 - 5.81 MIL/uL   Hemoglobin 13.6  13.0 - 17.0 g/dL   HCT 21.3 (*) 08.6 - 57.8 %   MCV 83.5  78.0 - 100.0 fL   MCH 30.0  26.0 - 34.0 pg   MCHC 35.9  30.0 - 36.0 g/dL   RDW 46.9  62.9 - 52.8 %   Platelets 169  150 - 400 K/uL  BASIC METABOLIC PANEL     Status: Abnormal   Collection Time    04/06/13  5:10 AM      Result Value Range   Sodium 133 (*) 135 - 145 mEq/L   Potassium 3.9  3.5 - 5.1 mEq/L   Chloride 100  96 - 112 mEq/L   CO2 22  19 - 32 mEq/L   Glucose, Bld 104 (*) 70 - 99 mg/dL   BUN 15  6 - 23 mg/dL   Creatinine, Ser 4.13  0.50 - 1.35 mg/dL   Calcium 9.1  8.4 - 24.4 mg/dL   GFR calc non Af Amer 69 (*) >90 mL/min   GFR calc Af Amer 80 (*) >90 mL/min   Comment:            The eGFR has been calculated     using the CKD EPI equation.     This calculation has not been     validated in all clinical     situations.     eGFR's persistently     <90 mL/min signify     possible Chronic Kidney Disease.  CBC     Status: Abnormal   Collection Time    04/07/13  4:40 AM      Result Value Range   WBC 11.7 (*) 4.0 - 10.5 K/uL   RBC 4.43  4.22 - 5.81 MIL/uL   Hemoglobin 13.2  13.0 - 17.0 g/dL  HCT 36.8 (*) 39.0 - 52.0 %   MCV 83.1  78.0 - 100.0 fL   MCH 29.8  26.0 - 34.0 pg   MCHC 35.9  30.0 - 36.0 g/dL   RDW 16.1  09.6 - 04.5 %   Platelets 176  150 - 400 K/uL   BASIC METABOLIC PANEL     Status: Abnormal   Collection Time    04/07/13  4:40 AM      Result Value Range   Sodium 137  135 - 145 mEq/L   Potassium 3.8  3.5 - 5.1 mEq/L   Chloride 101  96 - 112 mEq/L   CO2 26  19 - 32 mEq/L   Glucose, Bld 94  70 - 99 mg/dL   BUN 15  6 - 23 mg/dL   Creatinine, Ser 4.09  0.50 - 1.35 mg/dL   Calcium 9.2  8.4 - 81.1 mg/dL   GFR calc non Af Amer 79 (*) >90 mL/min   GFR calc Af Amer >90  >90 mL/min   Comment:            The eGFR has been calculated     using the CKD EPI equation.     This calculation has not been     validated in all clinical     situations.     eGFR's persistently     <90 mL/min signify     possible Chronic Kidney Disease.    Dg Chest 2 View  04/05/2013  *RADIOLOGY REPORT*  Clinical Data: Fever, rash  CHEST - 2 VIEW  Comparison: Prior chest x-ray 03/29/2013  Findings: Near total interval resolution of pulmonary edema compared to prior.  Cardiac and mediastinal contours within normal limits.  There is atherosclerotic calcification in the transverse aorta.  Very mild central vascular congestion.  No pleural effusion or pneumothorax.  No acute osseous abnormality.  IMPRESSION:  1.  No acute cardiopulmonary abnormality. 2.  Near total interval resolution of pulmonary edema with only minimal residual central vascular congestion compared to 03/29/2013. 3.  The heart is normal in size.   Original Report Authenticated By: Malachy Moan, M.D.     Review of Systems  Respiratory: Negative for shortness of breath and wheezing.   Cardiovascular: Negative for chest pain.  Skin: Positive for itching and rash.   Blood pressure 104/60, pulse 62, temperature 98.4 F (36.9 C), temperature source Oral, resp. rate 20, height 5\' 10"  (1.778 m), weight 207 lb 14.4 oz (94.303 kg), SpO2 96.00%. Physical Exam  Constitutional: He appears well-developed and well-nourished. No distress.  Neck:  No angioedema  Cardiovascular: Normal rate, regular rhythm and  intact distal pulses.  Exam reveals friction rub. Exam reveals no gallop.   No murmur heard. Respiratory: Effort normal and breath sounds normal. No respiratory distress. He has no wheezes. He has no rales.  Skin: Skin is warm and dry. Rash noted. He is not diaphoretic. There is erythema.  + diffuse, patchy erythema throughout torso, upper back, bilateral arms, and upper bilateral legs. No blisters or vesicular lesions. The skin is non-tender  Psychiatric: He has a normal mood and affect. His behavior is normal.    Assessment/Plan: Principal Problem: Skin Rash Active Problems:   S/P coronary artery stent placement, LCX, emergently 03/29/13   S/P angioplasty with stent 03/31/13 to RV ostial marginal with DES Xience Xpedition stent  Plan: According to the patient, the rash shows some signs of improvement, although erythema persist. No angioedema. Prior to first admission, the  patient was not on any oral medications. As any medication carries the risk of potential hypersensitive reaction, it is hard to pinpoint the culprit.  Coreg is a good alternative for Lopressor.  Due to soft BP, I feel that it was reasonable to discontinue his Lisinopril.  HR and BP both stable. If rash worsens, can consider switching Brilinta to Effient.    Allayne Butcher, PA-C 04/07/2013, 11:08 AM   Patient seen and examined. Agree with assessment and plan.  Very pleasant 47 yo AAM known to me from his recent MI and acute PCI to a totally occluded LCX. At that time he had developed torsade du pointes en route to [facility] in the ambulance and required 4 shocks for restoration of normal rhythm . He subsequently underwent staged PCI to his RCA 2 days later. He presented this admission with rash initially at the pad site on his lateral chest, fever, elevated WBC at 17.8 with 90% polys suggesting more infection rather than fixed drug eruption. Eosinophils were 0. He had received IV cipro for 4 doses, dc'd yesterday. Question if  secondary to initial skin burn from defibrillations with subsequent cellulitis.  However, he now has a more diffuse maculopapular rash. Consider institution of oral antiobiotic in light of initial presentation to complete treatment course for cellulitis. WBC has decreased with antibiotic therapy,  and one would expect this to increase with prednisone treatment  Agree with attempt at changing medications. Will also change brilinta to effient 10 mg daily.   Lennette Bihari, MD, [facility] 04/07/2013 12:21 PM

## 2013-04-07 NOTE — Discharge Summary (Signed)
I have seen and evaluated the patient along with Boyce Medici, PA. I agree her d/c summary.  Please see my last PN for details.    Marykay Lex, M.D., M.S. [facility] [address] [place], [place]  [address]  [phone] Pager # [phone] 04/07/2013 4:15 PM

## 2013-04-07 NOTE — Care Management Note (Signed)
    Page 1 of 1   04/08/2013     4:13:26 PM   CARE MANAGEMENT NOTE 04/08/2013  Patient:  Ricky Roberts,Ricky Roberts   Account Number:  [id number]  Date Initiated:  04/07/2013  Documentation initiated by:  Tiawana Forgy  Subjective/Objective Assessment:   PT ADM ON 04/05/13 WITH ALLERGIC RXN/?CELLULITIS.  PTA, PT LIVES AT HOME WITH SPOUSE.     Action/Plan:   WILL FOLLOW FOR HOME NEEDS AS PT PROGRESSES.   Anticipated DC Date:  04/09/2013   Anticipated DC Plan:  HOME W HOME HEALTH SERVICES      DC Planning Services  CM consult      Choice offered to / List presented to:             Status of service:  Completed, signed off Medicare Important Message given?   (If response is "NO", the following Medicare IM given date fields will be blank) Date Medicare IM given:   Date Additional Medicare IM given:    Discharge Disposition:  HOME/SELF CARE  Per UR Regulation:  Reviewed for med. necessity/level of care/duration of stay  If discussed at Long Length of Stay Meetings, dates discussed:    Comments:  04/08/13 Rosalita Chessman [phone] MD DC'D Marden Noble AND STARTED EFFIENT.   PT GIVEN EFFIENT FREE 30 DAY TRIAL CARD AND COPAY CARD.

## 2013-04-08 LAB — BASIC METABOLIC PANEL
BUN: 14 mg/dL (ref 6–23)
Calcium: 9.2 mg/dL (ref 8.4–10.5)
Creatinine, Ser: 1.01 mg/dL (ref 0.50–1.35)
GFR calc non Af Amer: 88 mL/min — ABNORMAL LOW (ref >90)
Glucose, Bld: 97 mg/dL (ref 70–99)

## 2013-04-08 LAB — CBC
HCT: 36.1 % — ABNORMAL LOW (ref 39.0–52.0)
Hemoglobin: 13.3 g/dL (ref 13.0–17.0)
MCH: 30.4 pg (ref 26.0–34.0)
MCHC: 36.8 g/dL — ABNORMAL HIGH (ref 30.0–36.0)

## 2013-04-08 MED ORDER — PRASUGREL HCL 10 MG PO TABS: 10.0000 mg | ORAL_TABLET | Freq: Every day | ORAL | Status: DC

## 2013-04-08 MED ORDER — FAMOTIDINE 20 MG PO TABS
20.0000 mg | ORAL_TABLET | Freq: Every day | ORAL | Status: DC
  Administered 2013-04-08: 20 mg via ORAL
  Filled 2013-04-08: qty 1

## 2013-04-08 MED ORDER — LORATADINE 10 MG PO TABS
10.0000 mg | ORAL_TABLET | Freq: Every day | ORAL | Status: DC
  Administered 2013-04-08: 10 mg via ORAL
  Filled 2013-04-08: qty 1

## 2013-04-08 MED ORDER — DIPHENHYDRAMINE HCL 25 MG PO CAPS: 25.0000 mg | ORAL_CAPSULE | Freq: Four times a day (QID) | ORAL | Status: DC | PRN

## 2013-04-08 MED ORDER — LORATADINE 10 MG PO TABS: 10.0000 mg | ORAL_TABLET | Freq: Every day | ORAL | Status: AC

## 2013-04-08 MED ORDER — CARVEDILOL 12.5 MG PO TABS: 12.5000 mg | ORAL_TABLET | Freq: Two times a day (BID) | ORAL | Status: DC

## 2013-04-08 MED ORDER — PREDNISONE 10 MG PO TABS: ORAL_TABLET | ORAL | Status: DC

## 2013-04-08 MED ORDER — PREDNISONE 50 MG PO TABS
50.0000 mg | ORAL_TABLET | Freq: Every day | ORAL | Status: DC
  Filled 2013-04-08: qty 1

## 2013-04-08 MED ORDER — FAMOTIDINE 20 MG PO TABS: 20.0000 mg | ORAL_TABLET | Freq: Every day | ORAL | Status: DC

## 2013-04-08 NOTE — Progress Notes (Signed)
Subjective: Mild itching on back, overall he feels better.  Objective: Vital signs in last 24 hours: Temp:  [97.5 F (36.4 C)-98.4 F (36.9 C)] 97.5 F (36.4 C) (05/06 0349) Pulse Rate:  [59-77] 66 (05/06 0630) Resp:  [16-18] 18 (05/06 0349) BP: (113-124)/(61-76) 124/64 mmHg (05/06 0630) SpO2:  [95 %-98 %] 98 % (05/06 0349) Weight change:  Last BM Date: 04/06/13 Intake/Output from previous day: -1230 05/05 0701 - 05/06 0700 In: 720 [P.O.:720] Out: 1950 [Urine:1950] Intake/Output this shift:    PE: General:alert and oriented, pleasant affect mild itching on back Skin: Back with macular rash, more dense at site of External pad, Front pad site now peeling without rash,  Face now with peeling skin.  Heart:S1S2 RRR Lungs:clear without rales rhonchi or wheezes Abd:+BS soft non tender Ext:no edema, +pedal pulses.    Lab Results:  Recent Labs  04/07/13 0440 04/08/13 0440  WBC 11.7* 11.3*  HGB 13.2 13.3  HCT 36.8* 36.1*  PLT 176 219   BMET  Recent Labs  04/07/13 0440 04/08/13 0440  NA 137 137  K 3.8 3.7  CL 101 101  CO2 26 22  GLUCOSE 94 97  BUN 15 14  CREATININE 1.11 1.01  CALCIUM 9.2 9.2   No results found for this basename: TROPONINI, CK, MB,  in the last 72 hours  Lab Results  Component Value Date   CHOL 172 04/01/2013   HDL 45 04/01/2013   LDLCALC 97 04/01/2013   TRIG 152* 04/01/2013   CHOLHDL 3.8 04/01/2013   Lab Results  Component Value Date   HGBA1C 5.4 04/01/2013     No results found for this basename: TSH      Studies/Results: Echo: Left ventricle: The cavity size was normal. There was mild concentric hypertrophy. Systolic function was mildly to moderately reduced. The estimated ejection fraction was in the range of 40% to 45%. Moderate hypokinesis of the anterolateral and inferolateral myocardium. Doppler parameters are consistent with restrictive physiology, indicative of decreased left ventricular diastolic compliance and/or increased  left atrial pressure. Doppler parameters are consistent with elevated mean left atrial filling pressure. - Mitral valve: Mild regurgitation. - Left atrium: The atrium was mildly dilated. - Atrial septum: No defect or patent foramen ovale was identified. - Pulmonary arteries: Systolic pressure was moderately increased. PA peak pressure: 58mm Hg (S).   Medications: I have reviewed the patient's current medications. Scheduled Meds: . amiodarone  200 mg Oral BID  . aspirin EC  81 mg Oral Daily  . atorvastatin  80 mg Oral q1800  . carvedilol  12.5 mg Oral BID WC  . heparin  5,000 Units Subcutaneous Q8H  . magnesium oxide  400 mg Oral BID  . prasugrel  10 mg Oral Daily  . predniSONE  60 mg Oral Q breakfast   Continuous Infusions:  PRN Meds:.acetaminophen, diphenhydrAMINE, ibuprofen  Assessment/Plan: Principal Problem:   Cellulitis Active Problems:   S/P coronary artery stent placement, LCX, emergently 03/29/13   S/P angioplasty with stent 03/31/13 to RV ostial marginal with DES Xience Xpedition stent  PLAN: ? Cellulites vs. Reaction to pads. Now with skin peeling ? Reaction.  meds have been changed.  Rash improving.   Pt ambulates in hall without complaints.  No tele due to possible reaction to electrodes.on steriod.  Add pepcid and claritin.  LOS: 3 days   Time spent with pt. :20 minutes. [facility] R  Nurse Practitioner Certified Pager [phone] 04/08/2013, 7:56 AM

## 2013-04-08 NOTE — Progress Notes (Signed)
Pt discharge instructions and patient education completed. IV site d/c. Site WNL. Prescriptions given to wife. Questions and concerns answered. Case manager to talk with patient and wife about Effient. D/C home. Dion Saucier

## 2013-04-08 NOTE — Discharge Summary (Signed)
Physician Discharge Summary  Ricky Roberts ZOX:[id number] DOB: 1966-01-29 DOA: 04/05/2013  PCP: Ricky Cirri, DO  Admit date: 04/05/2013 Discharge date: 04/08/2013  Time spent: 35 minutes  Recommendations for Outpatient Follow-up:  1. Keep appointment with cards tomm  Discharge Diagnoses:  Principal Problem:   Cellulitis Active Problems:   S/P coronary artery stent placement, LCX, emergently 03/29/13   S/P angioplasty with stent 03/31/13 to RV ostial marginal with DES Xience Xpedition stent   Discharge Condition: improved  Diet recommendation: cardiac  Filed Weights   04/05/13 0419 04/06/13 0504  Weight: 95 kg (209 lb 7 oz) 94.303 kg (207 lb 14.4 oz)    History of present illness:  Ricky Roberts is a 47 y.o. male who was just discharged from the hospital following cardiac arrest due to STEMI. The patient notes that over the past few days and in particular today he has had increasing erythema throughout the torso as well as increasing puritis. The patient notes that he has been on several new meds since discharge, and that cardiac pacer pads were placed during his stay (required defib x4). There are no clear alleviating or exacerbating factors, hydrocortisone cream does not seem to help his wife states.  In the ED patient was noted to have fever of 100.6, WBC of 16.8. He was initially treated with benadryl and steroids, but later started on clindamycin IV when it was felt that the rash likely represents a cellulitis.   Hospital Course:  1. Rash: appears to be an allergic rxn to medications - d/c'd lisinopril- changed metoprolol to coreg- patient did have about an hour of sun exposure on Friday about 4:30 PM. Continue steroids, benadryl - consult cards about change in mediations: they changed blood thinners- improving  .  2. Will avoid using adhesive unless absolutely needed so will hold off on putting patient on a tele monitor for now (discussed this with patient)  Fever; tylenol PRN- ?  Rxn to allergies- U/A and chest x ray ok, BC negative, no abx and no fever x 48 hours    Leukocytosis- steroids- decreasing      Procedures:  none  Consultations:  cards  Discharge Exam: Filed Vitals:   04/07/13 1951 04/08/13 0349 04/08/13 0630 04/08/13 1330  BP: 113/61 115/66 124/64 106/68  Pulse: 70 59 66 72  Temp: 98.4 F (36.9 C) 97.5 F (36.4 C)  98.9 F (37.2 C)  TempSrc: Oral Oral  Oral  Resp: 18 18  20   Height:      Weight:      SpO2: 96% 98%  96%    General: a+Ox3, NAd Cardiovascular: rrr Respiratory: clear  Discharge Instructions      Discharge Orders   Future Orders Complete By Expires     Diet - low sodium heart healthy  As directed     Increase activity slowly  As directed         Medication List    STOP taking these medications       lisinopril 10 MG tablet  Commonly known as:  PRINIVIL,ZESTRIL     metoprolol 50 MG tablet  Commonly known as:  LOPRESSOR     Ticagrelor 90 MG Tabs tablet  Commonly known as:  BRILINTA      TAKE these medications       amiodarone 200 MG tablet  Commonly known as:  PACERONE  Take 1 tablet (200 mg total) by mouth 2 (two) times daily.     aspirin 81 MG EC tablet  Take  1 tablet (81 mg total) by mouth daily.     atorvastatin 80 MG tablet  Commonly known as:  LIPITOR  Take 1 tablet (80 mg total) by mouth daily at 6 PM.     carvedilol 12.5 MG tablet  Commonly known as:  COREG  Take 1 tablet (12.5 mg total) by mouth 2 (two) times daily with a meal.     diphenhydrAMINE 25 mg capsule  Commonly known as:  BENADRYL  Take 1 capsule (25 mg total) by mouth every 6 (six) hours as needed for itching.     famotidine 20 MG tablet  Commonly known as:  PEPCID  Take 1 tablet (20 mg total) by mouth daily.     ibuprofen 200 MG tablet  Commonly known as:  ADVIL,MOTRIN  Take 400 mg by mouth daily as needed for pain.     loratadine 10 MG tablet  Commonly known as:  CLARITIN  Take 1 tablet (10 mg total) by mouth  daily.     magnesium oxide 400 (241.3 MG) MG tablet  Commonly known as:  MAG-OX  Take 1 tablet (400 mg total) by mouth 2 (two) times daily.     prasugrel 10 MG Tabs  Commonly known as:  EFFIENT  Take 1 tablet (10 mg total) by mouth daily.     predniSONE 10 MG tablet  Commonly known as:  DELTASONE  10 mg: 50 mg x2 days, 40mg  x 2 days, 30 mg x 2 days, 20 mg x 2 days, 10 mg x 2 days and d/c  Start taking on:  04/09/2013       Allergies  Allergen Reactions  . Lisinopril Rash    rash   Follow-up Information   Follow up with [facility], DO In 1 week.   Contact information:   [address] [place] [address] [phone]        The results of significant diagnostics from this hospitalization (including imaging, microbiology, ancillary and laboratory) are listed below for reference.    Significant Diagnostic Studies: Dg Chest 2 View  04/05/2013  *RADIOLOGY REPORT*  Clinical Data: Fever, rash  CHEST - 2 VIEW  Comparison: Prior chest x-ray 03/29/2013  Findings: Near total interval resolution of pulmonary edema compared to prior.  Cardiac and mediastinal contours within normal limits.  There is atherosclerotic calcification in the transverse aorta.  Very mild central vascular congestion.  No pleural effusion or pneumothorax.  No acute osseous abnormality.  IMPRESSION:  1.  No acute cardiopulmonary abnormality. 2.  Near total interval resolution of pulmonary edema with only minimal residual central vascular congestion compared to 03/29/2013. 3.  The heart is normal in size.   Original Report Authenticated By: Malachy Moan, M.D.    Dg Chest Port 1 View  03/29/2013  *RADIOLOGY REPORT*  Clinical Data: Code ST elevation myocardial infarction, cough  PORTABLE CHEST - 1 VIEW  Comparison: None.  Findings: Heart size is upper limits of normal.  Central vascular congestion noted with hazy perihilar airspace opacities but no focal opacity otherwise.  No pleural effusion. Fine detail is  obscured by patient body habitus.  Mild right AC joint degenerative change.  No acute osseous abnormality.  IMPRESSION: Mild cardiomegaly with central vascular congestion and hazy perihilar airspace opacities which could in part be due to overlying soft tissue structures, although early edema could have a similar appearance.   Original Report Authenticated By: Christiana Pellant, M.D.     Microbiology: Recent Results (from the past 240 hour(s))  MRSA PCR SCREENING     Status: None   Collection Time    03/29/13 11:29 PM      Result Value Range Status   MRSA by PCR NEGATIVE  NEGATIVE Final   Comment:            The GeneXpert MRSA Assay (FDA     approved for NASAL specimens     only), is one component of a     comprehensive MRSA colonization     surveillance program. It is not     intended to diagnose MRSA     infection nor to guide or     monitor treatment for     MRSA infections.  CULTURE, BLOOD (ROUTINE X 2)     Status: None   Collection Time    04/05/13  8:45 AM      Result Value Range Status   Specimen Description BLOOD LEFT HAND   Final   Special Requests BOTTLES DRAWN AEROBIC ONLY 1.5CC   Final   Culture  Setup Time 04/05/2013 18:12   Final   Culture     Final   Value:        BLOOD CULTURE RECEIVED NO GROWTH TO DATE CULTURE WILL BE HELD FOR 5 DAYS BEFORE ISSUING A FINAL NEGATIVE REPORT   Report Status PENDING   Incomplete  CULTURE, BLOOD (ROUTINE X 2)     Status: None   Collection Time    04/05/13  8:50 AM      Result Value Range Status   Specimen Description BLOOD RIGHT HAND   Final   Special Requests BOTTLES DRAWN AEROBIC ONLY 3CC   Final   Culture  Setup Time 04/05/2013 18:12   Final   Culture     Final   Value:        BLOOD CULTURE RECEIVED NO GROWTH TO DATE CULTURE WILL BE HELD FOR 5 DAYS BEFORE ISSUING A FINAL NEGATIVE REPORT   Report Status PENDING   Incomplete     Labs: Basic Metabolic Panel:  Recent Labs Lab 04/05/13 0057 04/05/13 0615 04/06/13 0510  04/07/13 0440 04/08/13 0440  NA 129* 134* 133* 137 137  K 4.1 4.3 3.9 3.8 3.7  CL 96 101 100 101 101  CO2 21 21 22 26 22   GLUCOSE 112* 125* 104* 94 97  BUN 14 13 15 15 14   CREATININE 1.34 1.21 1.24 1.11 1.01  CALCIUM 9.3 9.2 9.1 9.2 9.2   Liver Function Tests:  Recent Labs Lab 04/05/13 0615  AST 34  ALT 51  ALKPHOS 67  BILITOT 0.8  PROT 7.3  ALBUMIN 3.4*   No results found for this basename: LIPASE, AMYLASE,  in the last 168 hours No results found for this basename: AMMONIA,  in the last 168 hours CBC:  Recent Labs Lab 04/02/13 0525 04/05/13 0057 04/05/13 0615 04/06/13 0510 04/07/13 0440 04/08/13 0440  WBC 10.0 16.8* 17.8* 17.1* 11.7* 11.3*  NEUTROABS  --  15.2*  --   --   --   --   HGB 15.0 15.1 14.7 13.6 13.2 13.3  HCT 41.7 41.5 40.9 37.9* 36.8* 36.1*  MCV 85.5 85.2 84.7 83.5 83.1 82.4  PLT 163 192 194 169 176 219   Cardiac Enzymes: No results found for this basename: CKTOTAL, CKMB, CKMBINDEX, TROPONINI,  in the last 168 hours BNP: BNP (last 3 results) No results found for this basename: PROBNP,  in the last 8760 hours CBG: No results found for this basename: GLUCAP,  in the last 168 hours     Signed:  Marlin Canary  [facility] 04/08/2013, 3:40 PM

## 2013-04-08 NOTE — Progress Notes (Signed)
Pt. Seen and examined. Agree with the NP/PA-C note as written.  Rash is improving - less pruritis. Diffuse macular rash improved, now with dark pigmentation - there is desquamation over the chest and back. He was switched from Brillinta to Effient, however, we do not suspect this is a drug rash. He has an appointment in the office tomorrow with Dr. Tresa Endo - probably close to being able to be discharged.  Chrystie Nose, MD, [facility] Attending Cardiologist [facility]

## 2013-04-10 ENCOUNTER — Other Ambulatory Visit (HOSPITAL_COMMUNITY): Payer: Self-pay | Admitting: Physician Assistant

## 2013-04-10 DIAGNOSIS — I723 Aneurysm of iliac artery: Secondary | ICD-10-CM

## 2013-04-11 LAB — CULTURE, BLOOD (ROUTINE X 2): Culture: NO GROWTH

## 2013-04-22 ENCOUNTER — Ambulatory Visit (HOSPITAL_COMMUNITY)
Admission: RE | Admit: 2013-04-22 | Discharge: 2013-04-22 | Disposition: A | Discharge status: Home / Self Care | Payer: BC Managed Care – PPO | Source: Ambulatory Visit | Attending: Internal Medicine | Admitting: Internal Medicine

## 2013-04-22 DIAGNOSIS — I739 Peripheral vascular disease, unspecified: Secondary | ICD-10-CM

## 2013-04-22 DIAGNOSIS — I723 Aneurysm of iliac artery: Secondary | ICD-10-CM | POA: Insufficient documentation

## 2013-04-22 DIAGNOSIS — I70219 Atherosclerosis of native arteries of extremities with intermittent claudication, unspecified extremity: Secondary | ICD-10-CM

## 2013-04-22 NOTE — Progress Notes (Signed)
Arterial Duplex Lower Ext. Completed. Ricky Roberts

## 2013-04-25 ENCOUNTER — Telehealth: Payer: Self-pay | Admitting: Cardiology

## 2013-04-25 NOTE — Telephone Encounter (Signed)
Message forwarded to S. Martin, RN.  

## 2013-04-25 NOTE — Telephone Encounter (Signed)
Wants to know if his paperwork is ready for work?

## 2013-04-30 NOTE — Telephone Encounter (Signed)
Had left a message for the patient to pick up his form.It was left at the front desk to pick up.

## 2013-05-01 ENCOUNTER — Telehealth: Payer: Self-pay | Admitting: Cardiovascular Disease

## 2013-05-01 MED ORDER — FAMOTIDINE 20 MG PO TABS: 20.0000 mg | ORAL_TABLET | Freq: Every day | ORAL | Status: DC

## 2013-05-01 MED ORDER — PRASUGREL HCL 10 MG PO TABS: 10.0000 mg | ORAL_TABLET | Freq: Every day | ORAL | Status: DC

## 2013-05-01 MED ORDER — CARVEDILOL 12.5 MG PO TABS: 12.5000 mg | ORAL_TABLET | Freq: Two times a day (BID) | ORAL | Status: DC

## 2013-05-01 NOTE — Telephone Encounter (Signed)
Is returning your call about his test results .Marland Kitchen Please call at [phone]

## 2013-05-01 NOTE — Telephone Encounter (Signed)
rx refill

## 2013-05-01 NOTE — Telephone Encounter (Signed)
Spoke with patient about le doppler results

## 2013-05-05 ENCOUNTER — Encounter: Payer: Self-pay | Admitting: Cardiovascular Disease

## 2013-05-05 ENCOUNTER — Ambulatory Visit (INDEPENDENT_AMBULATORY_CARE_PROVIDER_SITE_OTHER): Payer: BC Managed Care – PPO | Admitting: Cardiovascular Disease

## 2013-05-05 VITALS — BP 118/90 | HR 61 | Ht 70.0 in | Wt 208.0 lb

## 2013-05-05 DIAGNOSIS — R079 Chest pain, unspecified: Secondary | ICD-10-CM

## 2013-05-05 DIAGNOSIS — I2510 Atherosclerotic heart disease of native coronary artery without angina pectoris: Secondary | ICD-10-CM | POA: Insufficient documentation

## 2013-05-05 DIAGNOSIS — Z79899 Other long term (current) drug therapy: Secondary | ICD-10-CM

## 2013-05-05 DIAGNOSIS — D689 Coagulation defect, unspecified: Secondary | ICD-10-CM

## 2013-05-05 DIAGNOSIS — I739 Peripheral vascular disease, unspecified: Secondary | ICD-10-CM | POA: Insufficient documentation

## 2013-05-05 MED ORDER — ISOSORBIDE MONONITRATE ER 30 MG PO TB24: ORAL_TABLET | ORAL | Status: DC

## 2013-05-05 NOTE — Progress Notes (Signed)
05/05/2013 Ricky Roberts   02-14-1966  [id number]  Primary Physician Gabriel Cirri, DO Primary Cardiologist: Runell Gess MD Roseanne Reno   HPI:  The patient is a 47 year old African American male with history of coronary artery disease and was recently hospitalized from March 29, 2013, until April 02, 2013, with an ST-elevation myocardial infarction on the inferolateral wall, as well as cardiac arrest secondary to torsades, requiring 4 shocks by EMS. He had a coronary artery stent placement to the left circumflex emergently, and then angioplasty with stent on April 28 to the RV ostial marginal with a drug-eluting Xience Xpedition stent. He also had problems with NSVT. He was treated with aspirin and Brilinta as well as amiodarone. He was subsequently rehospitalized from Apr 05, 2013, to Apr 08, 2013, with a diffuse rash that was likely secondary to [place]. He was changed to Effient 10 mg a day and treated with Benadryl and prednisone taper.   The patient presents today for followup after both those hospitalizations. The patient currently has no complaints. He says that the rash is slowly clearing up. He denies any nausea, vomiting, fever, lightheadedness, dizziness, shortness of breath, lower extremity edema, orthopnea, chest pain, diarrhea, constipation, indigestion. Also noted on his coronary angiogram when the abdominal runoff was done, he had a 90% right common iliac artery stenosis. Patient does notice some leg pain with ambulation, apparently more so prior to his hospitalization.    The patient has complained of chest pain her last several days which has some anginal components but not similar to his heart attack pain. He also complains of left relevant claudication and had a 90% ostial right common iliac artery stenosis documented at the time of cath with abnormal Dopplers performed subsequently.    Current Outpatient Prescriptions  Medication Sig Dispense Refill  . amiodarone  (PACERONE) 200 MG tablet Take 1 tablet (200 mg total) by mouth 2 (two) times daily.  60 tablet  5  . aspirin EC 81 MG EC tablet Take 1 tablet (81 mg total) by mouth daily.      Marland Kitchen atorvastatin (LIPITOR) 80 MG tablet Take 1 tablet (80 mg total) by mouth daily at 6 PM.  30 tablet  5  . carvedilol (COREG) 12.5 MG tablet Take 1 tablet (12.5 mg total) by mouth 2 (two) times daily with a meal.  60 tablet  6  . diphenhydrAMINE (BENADRYL) 25 mg capsule Take 1 capsule (25 mg total) by mouth every 6 (six) hours as needed for itching.  30 capsule  0  . famotidine (PEPCID) 20 MG tablet Take 1 tablet (20 mg total) by mouth daily.  30 tablet  6  . ibuprofen (ADVIL,MOTRIN) 200 MG tablet Take 400 mg by mouth daily as needed for pain.      Marland Kitchen loratadine (CLARITIN) 10 MG tablet Take 1 tablet (10 mg total) by mouth daily.  30 tablet  0  . magnesium oxide (MAG-OX) 400 (241.3 MG) MG tablet Take 1 tablet (400 mg total) by mouth 2 (two) times daily.  60 tablet  5  . prasugrel (EFFIENT) 10 MG TABS Take 1 tablet (10 mg total) by mouth daily.  30 tablet  6   No current facility-administered medications for this visit.    Allergies  Allergen Reactions  . Lisinopril Rash    rash    History   Social History  . Marital Status: Married    Spouse Name: N/A    Number of Children: N/A  . Years of Education: N/A  Occupational History  . Not on file.   Social History Main Topics  . Smoking status: Former Smoker -- 23 years    Types: Cigarettes    Quit date: 03/04/2012  . Smokeless tobacco: Not on file  . Alcohol Use: 3.0 oz/week    5 Cans of beer per week     Comment: 5-6 cans of beer daily  . Drug Use: Yes    Special: Other-see comments  . Sexually Active: Yes   Other Topics Concern  . Not on file   Social History Narrative  . No narrative on file     Review of Systems: General: negative for chills, fever, night sweats or weight changes.  Cardiovascular: negative for chest pain, dyspnea on exertion,  edema, orthopnea, palpitations, paroxysmal nocturnal dyspnea or shortness of breath Dermatological: negative for rash Respiratory: negative for cough or wheezing Urologic: negative for hematuria Abdominal: negative for nausea, vomiting, diarrhea, bright red blood per rectum, melena, or hematemesis Neurologic: negative for visual changes, syncope, or dizziness All other systems reviewed and are otherwise negative except as noted above.    Blood pressure 118/90, pulse 61, height 5\' 10"  (1.778 m), weight 208 lb (94.348 kg).  General appearance: alert and no distress Neck: no adenopathy, no carotid bruit, no JVD, supple, symmetrical, trachea midline and thyroid not enlarged, symmetric, no tenderness/mass/nodules Lungs: clear to auscultation bilaterally Heart: regular rate and rhythm, S1, S2 normal, no murmur, click, rub or gallop Extremities: extremities normal, atraumatic, no cyanosis or edema  EKG normal sinus rhythm at 61 with early R wave transition  ASSESSMENT AND PLAN:   Peripheral artery disease Patient has left the limiting claudication. He has angiographically documented right common iliac artery stenosis with abnormal Dopplers. He'll be admitted for angiography and percutaneous intervention for lifestyle limiting claudication.      Runell Gess MD FACP,FACC,FAHA, [facility] 05/05/2013 5:54 PM

## 2013-05-05 NOTE — Patient Instructions (Addendum)
Start Imdur 30mg  1/2 tablet daily  Keep the already scheduled appointment with Dr Tresa Endo  A lexiscan myoview stress test has been ordered to look at the bloodflow to your heart muscle.  Dr Allyson Sabal has ordered a lower extremity angiogram.  Dr. Allyson Sabal has ordered a peripheral angiogram to be done at [facility]  This procedure is going to look at the bloodflow in your lower extremities.  If Dr. Allyson Sabal is able to open up the arteries, you will have to spend one night in the hospital.  If he is not able to open the arteries, you will be able to go home that same day.    After the procedure, you will not be allowed to drive for 3 days or push, pull, or lift anything greater than 10 lbs for one week.    You will be required to have bloodwork  prior to your procedure.  Our scheduler will advise you on when these items need to be done.

## 2013-05-05 NOTE — Assessment & Plan Note (Signed)
Patient has left the limiting claudication. He has angiographically documented right common iliac artery stenosis with abnormal Dopplers. He'll be admitted for angiography and percutaneous intervention for lifestyle limiting claudication.

## 2013-05-09 ENCOUNTER — Ambulatory Visit (HOSPITAL_COMMUNITY)
Admission: RE | Admit: 2013-05-09 | Discharge: 2013-05-09 | Disposition: A | Discharge status: Home / Self Care | Payer: BC Managed Care – PPO | Source: Ambulatory Visit | Attending: Cardiology | Admitting: Cardiology

## 2013-05-09 DIAGNOSIS — Z01810 Encounter for preprocedural cardiovascular examination: Secondary | ICD-10-CM

## 2013-05-09 DIAGNOSIS — I2510 Atherosclerotic heart disease of native coronary artery without angina pectoris: Secondary | ICD-10-CM

## 2013-05-09 DIAGNOSIS — E663 Overweight: Secondary | ICD-10-CM | POA: Insufficient documentation

## 2013-05-09 DIAGNOSIS — R0602 Shortness of breath: Secondary | ICD-10-CM | POA: Insufficient documentation

## 2013-05-09 DIAGNOSIS — R079 Chest pain, unspecified: Secondary | ICD-10-CM | POA: Insufficient documentation

## 2013-05-09 LAB — CBC
HCT: 42.2 % (ref 39.0–52.0)
Hemoglobin: 14.7 g/dL (ref 13.0–17.0)
MCH: 29.9 pg (ref 26.0–34.0)
MCHC: 34.8 g/dL (ref 30.0–36.0)

## 2013-05-09 LAB — PROTIME-INR: INR: 0.93 (ref <1.50)

## 2013-05-09 LAB — BASIC METABOLIC PANEL
BUN: 10 mg/dL (ref 6–23)
Glucose, Bld: 89 mg/dL (ref 70–99)
Potassium: 4.5 mEq/L (ref 3.5–5.3)

## 2013-05-09 MED ORDER — REGADENOSON 0.4 MG/5ML IV SOLN
0.4000 mg | Freq: Once | INTRAVENOUS | Status: AC
Start: 2013-05-09 — End: 2013-05-09
  Administered 2013-05-09: 0.4 mg via INTRAVENOUS

## 2013-05-09 MED ORDER — TECHNETIUM TC 99M SESTAMIBI GENERIC - CARDIOLITE
11.0000 | Freq: Once | INTRAVENOUS | Status: AC | PRN
  Administered 2013-05-09: 11 via INTRAVENOUS

## 2013-05-09 MED ORDER — TECHNETIUM TC 99M SESTAMIBI GENERIC - CARDIOLITE
30.2000 | Freq: Once | INTRAVENOUS | Status: AC | PRN
  Administered 2013-05-09: 30.2 via INTRAVENOUS

## 2013-05-09 NOTE — Procedures (Addendum)
[facility] [address] [place] [address] [place] [address] [phone]  Cardiology Nuclear Med Study  Ricky Roberts is a 47 y.o. male     MRN : [id number]     DOB: 06/24/1966  Procedure Date: 05/09/2013  Nuclear Med Background Indication for Stress Test:  Surgical Clearance History:  CAD;MI 03/29/2013;STENT/PTCA--03/31/2013 Cardiac Risk Factors: Claudication, Family History - CAD, Lipids, Overweight and PVD  Symptoms:  Chest Pain and SOB   Nuclear Pre-Procedure Caffeine/Decaff Intake:  8:00pm NPO After: 6:00am   IV Site: R Forearm  IV 0.9% NS with Angio Cath:  22g  Chest Size (in):  46"  IV Started by: Emmit Pomfret, RN  Height: 5\' 10"  (1.778 m)  Cup Size: n/a  BMI:  Body mass index is 29.84 kg/(m^2). Weight:  208 lb (94.348 kg)   Tech Comments:  N/A    Nuclear Med Study 1 or 2 day study: 1 day  Stress Test Type:  Lexiscan  Order Authorizing Provider:  Nanetta Batty, MD   Resting Radionuclide: Technetium 78m Sestamibi  Resting Radionuclide Dose: 11.0 mCi   Stress Radionuclide:  Technetium 3m Sestamibi  Stress Radionuclide Dose: 30.2 mCi           Stress Protocol Rest HR: 59 Stress HR: 100  Rest BP: 124/80 Stress BP: 125/69  Exercise Time (min): n/a METS: n/a          Dose of Adenosine (mg):  n/a Dose of Lexiscan: 0.4 mg  Dose of Atropine (mg): n/a Dose of Dobutamine: n/a mcg/kg/min (at max HR)  Stress Test Technologist: Ernestene Mention, CCT Nuclear Technologist: Gonzella Lex, CNMT   Rest Procedure:  Myocardial perfusion imaging was performed at rest 45 minutes following the intravenous administration of Technetium 1m Sestamibi. Stress Procedure:  The patient received IV Lexiscan 0.4 mg over 15-seconds.  Technetium 96m Sestamibi injected at 30-seconds.  There were no significant changes with Lexiscan.  Quantitative spect images were obtained after a 45 minute delay.  Transient Ischemic Dilatation (Normal <1.22):   1.05 Lung/Heart Ratio (Normal <0.45):  0.28 QGS EDV:  159 ml QGS ESV:  108 ml LV Ejection Fraction: 32%  Signed by Gonzella Lex, CNMT  PHYSICIAN INTERPRETTION  Rest ECG: NSR - Normal EKG, T wve inversions in III, aVL, V6  Stress ECG: No significant change from baseline ECG and No significant ST segment change suggestive of ischemia.  QPS Raw Data Images:  Patient motion noted.  There is interference from nuclear activity from structures below the diaphragm. This does not affect the ability to read the study.  Stress Images:  There is decreased uptake in the inferior wall.  There is decreased uptake in the lateral wall. There is decreased uptake in the inferoseptum. There is no evidence of reversibility in this area.  There is a large sized, severe intensity, fixed perfusion defect in the entire inferior-lateral (inferio- & antero-) as well as inferoseptal wall. Rest Images:  There is decreased uptake in the inferior wall.   There is decreased uptake in the lateral wall.  There is decreased uptake in the inferoseptum. Subtraction (SDS):  There is a fixed defect that is most consistent with a previous infarction.   No reversibility is appreciated.  No evidence of ischemia.  Impression Exercise Capacity:  Lexiscan with no exercise. BP Response:  Normal blood pressure response. Clinical Symptoms:  There is dyspnea. ECG Impression:  No significant ECG changes with Lexiscan. Comparison with Prior Nuclear Study: No images to compare  Overall Impression: Abnormal Nuclear Perfusion Imaging.  Low risk stress nuclear study With a fixed Inferior-Inferolateral-lateral perfusion defect consistent with previous infarction wtih no evidence of ischemia..  LV Wall Motion:  Severely reduced LV function with severe hypokinesis to akinesis with poor thickening in the entire region with the fixed defect   Ricky Roberts W, MD  05/09/2013 1:14 PM

## 2013-05-13 ENCOUNTER — Ambulatory Visit (INDEPENDENT_AMBULATORY_CARE_PROVIDER_SITE_OTHER): Payer: BC Managed Care – PPO | Admitting: Cardiovascular Disease

## 2013-05-13 ENCOUNTER — Encounter: Payer: Self-pay | Admitting: Cardiovascular Disease

## 2013-05-13 VITALS — BP 122/84 | HR 62 | Ht 70.0 in | Wt 210.1 lb

## 2013-05-13 DIAGNOSIS — Z9861 Coronary angioplasty status: Secondary | ICD-10-CM

## 2013-05-13 DIAGNOSIS — I469 Cardiac arrest, cause unspecified: Secondary | ICD-10-CM

## 2013-05-13 DIAGNOSIS — I219 Acute myocardial infarction, unspecified: Secondary | ICD-10-CM

## 2013-05-13 DIAGNOSIS — I213 ST elevation (STEMI) myocardial infarction of unspecified site: Secondary | ICD-10-CM

## 2013-05-13 DIAGNOSIS — I2510 Atherosclerotic heart disease of native coronary artery without angina pectoris: Secondary | ICD-10-CM

## 2013-05-13 DIAGNOSIS — Z95820 Peripheral vascular angioplasty status with implants and grafts: Secondary | ICD-10-CM

## 2013-05-13 DIAGNOSIS — Z955 Presence of coronary angioplasty implant and graft: Secondary | ICD-10-CM

## 2013-05-13 DIAGNOSIS — Z9889 Other specified postprocedural states: Secondary | ICD-10-CM

## 2013-05-13 MED ORDER — ISOSORBIDE MONONITRATE ER 30 MG PO TB24: 30.0000 mg | ORAL_TABLET | Freq: Every day | ORAL | Status: DC

## 2013-05-13 MED ORDER — CARVEDILOL 12.5 MG PO TABS: ORAL_TABLET | ORAL | Status: DC

## 2013-05-13 NOTE — Patient Instructions (Signed)
Your physician has recommended you make the following change in your medication: amiodarone take 1 and 1/2 tablet daily for 1 month. Then take 1 tablet daily.  Isosorbide MN increase to 1 tablet daily.  Carvedilol 12.5 mg  Take 1 and 1/2 tablet twice daily.   Your physician recommends that you schedule a follow-up appointment in: 6 weeks.

## 2013-05-14 ENCOUNTER — Other Ambulatory Visit: Payer: Self-pay | Admitting: *Deleted

## 2013-05-14 DIAGNOSIS — Z01818 Encounter for other preprocedural examination: Secondary | ICD-10-CM

## 2013-05-22 ENCOUNTER — Encounter (HOSPITAL_COMMUNITY): Payer: Self-pay | Admitting: Respiratory Therapy

## 2013-05-30 ENCOUNTER — Telehealth: Payer: Self-pay | Admitting: *Deleted

## 2013-05-30 NOTE — Telephone Encounter (Signed)
Spoke with Ricky Roberts and let him know that his paper work from [employer] is filled out and ready for pick-up.

## 2013-06-02 ENCOUNTER — Ambulatory Visit (HOSPITAL_COMMUNITY)
Admission: RE | Admit: 2013-06-02 | Discharge: 2013-06-03 | Disposition: A | Discharge status: Home / Self Care | Payer: BC Managed Care – PPO | Source: Ambulatory Visit | Attending: Cardiovascular Disease | Admitting: Cardiovascular Disease

## 2013-06-02 ENCOUNTER — Encounter (HOSPITAL_COMMUNITY)
Admission: RE | Disposition: A | Discharge status: Home / Self Care | Payer: Self-pay | Source: Ambulatory Visit | Attending: Cardiovascular Disease

## 2013-06-02 ENCOUNTER — Other Ambulatory Visit: Payer: Self-pay | Admitting: Cardiology

## 2013-06-02 DIAGNOSIS — I252 Old myocardial infarction: Secondary | ICD-10-CM | POA: Insufficient documentation

## 2013-06-02 DIAGNOSIS — Z955 Presence of coronary angioplasty implant and graft: Secondary | ICD-10-CM

## 2013-06-02 DIAGNOSIS — I213 ST elevation (STEMI) myocardial infarction of unspecified site: Secondary | ICD-10-CM

## 2013-06-02 DIAGNOSIS — Z79899 Other long term (current) drug therapy: Secondary | ICD-10-CM | POA: Insufficient documentation

## 2013-06-02 DIAGNOSIS — Z7902 Long term (current) use of antithrombotics/antiplatelets: Secondary | ICD-10-CM | POA: Insufficient documentation

## 2013-06-02 DIAGNOSIS — Z95820 Peripheral vascular angioplasty status with implants and grafts: Secondary | ICD-10-CM

## 2013-06-02 DIAGNOSIS — Z8674 Personal history of sudden cardiac arrest: Secondary | ICD-10-CM | POA: Insufficient documentation

## 2013-06-02 DIAGNOSIS — I2510 Atherosclerotic heart disease of native coronary artery without angina pectoris: Secondary | ICD-10-CM | POA: Insufficient documentation

## 2013-06-02 DIAGNOSIS — Z01818 Encounter for other preprocedural examination: Secondary | ICD-10-CM

## 2013-06-02 DIAGNOSIS — I708 Atherosclerosis of other arteries: Secondary | ICD-10-CM | POA: Insufficient documentation

## 2013-06-02 DIAGNOSIS — I70219 Atherosclerosis of native arteries of extremities with intermittent claudication, unspecified extremity: Secondary | ICD-10-CM

## 2013-06-02 DIAGNOSIS — I739 Peripheral vascular disease, unspecified: Secondary | ICD-10-CM | POA: Diagnosis present

## 2013-06-02 DIAGNOSIS — Z9861 Coronary angioplasty status: Secondary | ICD-10-CM | POA: Insufficient documentation

## 2013-06-02 HISTORY — PX: LOWER EXTREMITY ANGIOGRAM: SHX5508

## 2013-06-02 HISTORY — PX: PERCUTANEOUS STENT INTERVENTION: SHX5500

## 2013-06-02 LAB — CBC
HCT: 39.8 % (ref 39.0–52.0)
Hemoglobin: 13.7 g/dL (ref 13.0–17.0)
MCH: 29.8 pg (ref 26.0–34.0)
MCHC: 34.4 g/dL (ref 30.0–36.0)

## 2013-06-02 LAB — BASIC METABOLIC PANEL
BUN: 9 mg/dL (ref 6–23)
CO2: 26 mEq/L (ref 19–32)
GFR calc non Af Amer: 79 mL/min — ABNORMAL LOW (ref >90)
Glucose, Bld: 98 mg/dL (ref 70–99)
Potassium: 3.8 mEq/L (ref 3.5–5.1)

## 2013-06-02 LAB — POCT ACTIVATED CLOTTING TIME
Activated Clotting Time: 196 seconds
Activated Clotting Time: 206 seconds
Activated Clotting Time: 160 seconds

## 2013-06-02 SURGERY — ANGIOGRAM, LOWER EXTREMITY
Anesthesia: LOCAL | Laterality: Right

## 2013-06-02 MED ORDER — DIAZEPAM 5 MG PO TABS
5.0000 mg | ORAL_TABLET | ORAL | Status: AC
  Administered 2013-06-02: 5 mg via ORAL

## 2013-06-02 MED ORDER — ISOSORBIDE MONONITRATE ER 30 MG PO TB24
30.0000 mg | ORAL_TABLET | Freq: Every day | ORAL | Status: DC
  Administered 2013-06-03: 10:00:00 30 mg via ORAL
  Filled 2013-06-02: qty 1

## 2013-06-02 MED ORDER — ATORVASTATIN CALCIUM 80 MG PO TABS
80.0000 mg | ORAL_TABLET | Freq: Every day | ORAL | Status: DC
  Filled 2013-06-02: qty 1

## 2013-06-02 MED ORDER — PRASUGREL HCL 10 MG PO TABS
10.0000 mg | ORAL_TABLET | Freq: Every day | ORAL | Status: DC
  Administered 2013-06-03: 10:00:00 10 mg via ORAL
  Filled 2013-06-02: qty 1

## 2013-06-02 MED ORDER — HEPARIN SODIUM (PORCINE) 1000 UNIT/ML IJ SOLN
INTRAMUSCULAR | Status: AC
  Filled 2013-06-02: qty 1

## 2013-06-02 MED ORDER — SODIUM CHLORIDE 0.9 % IV SOLN
INTRAVENOUS | Status: DC
  Administered 2013-06-02: 06:00:00 via INTRAVENOUS

## 2013-06-02 MED ORDER — DIPHENHYDRAMINE HCL 50 MG PO CAPS
50.0000 mg | ORAL_CAPSULE | Freq: Once | ORAL | Status: AC
  Administered 2013-06-02: 20:00:00 50 mg via ORAL
  Filled 2013-06-02: qty 1

## 2013-06-02 MED ORDER — DIAZEPAM 5 MG PO TABS
ORAL_TABLET | ORAL | Status: AC
  Filled 2013-06-02: qty 1

## 2013-06-02 MED ORDER — HEPARIN (PORCINE) IN NACL 2-0.9 UNIT/ML-% IJ SOLN
INTRAMUSCULAR | Status: AC
  Filled 2013-06-02: qty 1000

## 2013-06-02 MED ORDER — IBUPROFEN 400 MG PO TABS
400.0000 mg | ORAL_TABLET | Freq: Every day | ORAL | Status: DC | PRN
  Filled 2013-06-02: qty 1

## 2013-06-02 MED ORDER — SODIUM CHLORIDE 0.9 % IV SOLN: INTRAVENOUS | Status: AC

## 2013-06-02 MED ORDER — LIDOCAINE HCL (PF) 1 % IJ SOLN
INTRAMUSCULAR | Status: AC
  Filled 2013-06-02: qty 30

## 2013-06-02 MED ORDER — ACETAMINOPHEN 325 MG PO TABS: 650.0000 mg | ORAL_TABLET | ORAL | Status: DC | PRN

## 2013-06-02 MED ORDER — ASPIRIN 81 MG PO CHEW
324.0000 mg | CHEWABLE_TABLET | ORAL | Status: AC
  Administered 2013-06-02: 324 mg via ORAL

## 2013-06-02 MED ORDER — CARVEDILOL 6.25 MG PO TABS
18.7500 mg | ORAL_TABLET | Freq: Two times a day (BID) | ORAL | Status: DC
  Administered 2013-06-02: 19:00:00 18.75 mg via ORAL
  Filled 2013-06-02 (×4): qty 1

## 2013-06-02 MED ORDER — ASPIRIN 81 MG PO CHEW
CHEWABLE_TABLET | ORAL | Status: AC
  Filled 2013-06-02: qty 4

## 2013-06-02 MED ORDER — SODIUM CHLORIDE 0.9 % IJ SOLN: 3.0000 mL | INTRAMUSCULAR | Status: DC | PRN

## 2013-06-02 MED ORDER — DIPHENHYDRAMINE HCL 25 MG PO CAPS
25.0000 mg | ORAL_CAPSULE | Freq: Four times a day (QID) | ORAL | Status: DC | PRN
  Filled 2013-06-02: qty 2

## 2013-06-02 MED ORDER — ASPIRIN EC 81 MG PO TBEC
81.0000 mg | DELAYED_RELEASE_TABLET | Freq: Every day | ORAL | Status: DC
  Administered 2013-06-03: 81 mg via ORAL
  Filled 2013-06-02: qty 1

## 2013-06-02 MED ORDER — ONDANSETRON HCL 4 MG/2ML IJ SOLN: 4.0000 mg | Freq: Four times a day (QID) | INTRAMUSCULAR | Status: DC | PRN

## 2013-06-02 MED ORDER — AMIODARONE HCL 200 MG PO TABS
200.0000 mg | ORAL_TABLET | Freq: Two times a day (BID) | ORAL | Status: DC
Start: 2013-06-02 — End: 2013-06-03
  Administered 2013-06-02 – 2013-06-03 (×2): 200 mg via ORAL
  Filled 2013-06-02 (×3): qty 1

## 2013-06-02 MED ORDER — MORPHINE SULFATE 2 MG/ML IJ SOLN: 1.0000 mg | INTRAMUSCULAR | Status: DC | PRN

## 2013-06-02 MED ORDER — FAMOTIDINE 20 MG PO TABS
20.0000 mg | ORAL_TABLET | Freq: Every day | ORAL | Status: DC
  Administered 2013-06-03: 10:00:00 20 mg via ORAL
  Filled 2013-06-02: qty 1

## 2013-06-02 MED ORDER — MAGNESIUM OXIDE 400 (241.3 MG) MG PO TABS
400.0000 mg | ORAL_TABLET | Freq: Two times a day (BID) | ORAL | Status: DC
  Administered 2013-06-02 – 2013-06-03 (×2): 400 mg via ORAL
  Filled 2013-06-02 (×4): qty 1

## 2013-06-02 NOTE — CV Procedure (Signed)
Ricky Roberts is a 47 y.o. male    [id number] LOCATION:  FACILITY: [facility]  PHYSICIAN: Nanetta Batty, M.D. 1966/05/07   DATE OF PROCEDURE:  06/02/2013  DATE OF DISCHARGE:   PV INTERVENTION    History obtained from chart review.The patient is a 47 year old African American male with history of coronary artery disease and was recently hospitalized from March 29, 2013, until April 02, 2013, with an ST-elevation myocardial infarction on the inferolateral wall, as well as cardiac arrest secondary to torsades, requiring 4 shocks by EMS. He had a coronary artery stent placement to the left circumflex emergently, and then angioplasty with stent on April 28 to the RV ostial marginal with a drug-eluting Xience Xpedition stent. He also had problems with NSVT. He was treated with aspirin and Brilinta as well as amiodarone. He was subsequently rehospitalized from Apr 05, 2013, to Apr 08, 2013, with a diffuse rash that was likely secondary to [place]. He was changed to Effient 10 mg a day and treated with Benadryl and prednisone taper. During the initial hospitalization the patient was found to have a high-grade right common iliac artery stenosis. He does complain of right lower extremity lifestyle limiting claudication. Dopplers were consistent with these findings. The patient presents today for angiography and intervention of his right common iliac artery.     PROCEDURE DESCRIPTION:    The patient was brought to the second floor [facility] cath lab in the postabsorptive state. He waspremedicated with Valium 5 mg by mouth. His right groin was prepped and shaved in usual sterile fashion. Xylocaine 1% was used for local anesthesia. A 5 French sheath was inserted into the right common femoral  artery using standard Seldinger technique. The patient received 6500 units  of heparin  intravenously.  The ending ACT was 206. A total of 48 cc of contrast was administered during the case.  A 5 French pigtail  catheter was used for bilateral iliac artery angiography. Visipaque dye was used for the entirety of the case. Retrograde aortic pressure was monitored during the case    HEMODYNAMICS:    AO SYSTOLIC/AO DIASTOLIC: 115/64     ANGIOGRAPHIC RESULTS:   1: Angiography revealed a 80-90% proximal right common iliac artery stenosis.  Procedure description:the 5 French sheath was exchanged over a 35 Versicore  wire for a 6 French Bright tip sheath. Stenting was performed with an 8 mm x 24 mm long Cordis Genesis balloon expandable stent deployed at 10 atmospheres resulting in reduction of an 80% proximal right common iliac artery stenosis to 0% residual.  IMPRESSION:successful PTA and stenting of a high-grade right common iliac artery stenosis with balloon expandable  stent for lifestyle limiting claudication. The sheath will be removed once the ACT falls below 170 and pressure will be held on the groin to achieve hemostasis. The patient patient left the lab in stable condition. He will be hydrated overnight, and discharged home in the morning. He is already on aspirin and Effient for his prior coronary stent. Followup Dopplers will be obtained after which he'll see me back in the office.  Runell Gess MD, [facility] 06/02/2013 8:53 AM

## 2013-06-02 NOTE — H&P (Signed)
   Pt was reexamined and existing H & P reviewed. No changes found.  Runell Gess, MD [facility] 06/02/2013 8:11 AM

## 2013-06-03 ENCOUNTER — Encounter (HOSPITAL_COMMUNITY): Payer: Self-pay | Admitting: *Deleted

## 2013-06-03 DIAGNOSIS — Z9889 Other specified postprocedural states: Secondary | ICD-10-CM

## 2013-06-03 DIAGNOSIS — Z9861 Coronary angioplasty status: Secondary | ICD-10-CM

## 2013-06-03 DIAGNOSIS — Z01818 Encounter for other preprocedural examination: Secondary | ICD-10-CM

## 2013-06-03 DIAGNOSIS — I739 Peripheral vascular disease, unspecified: Secondary | ICD-10-CM

## 2013-06-03 DIAGNOSIS — I2510 Atherosclerotic heart disease of native coronary artery without angina pectoris: Secondary | ICD-10-CM

## 2013-06-03 DIAGNOSIS — I219 Acute myocardial infarction, unspecified: Secondary | ICD-10-CM

## 2013-06-03 LAB — BASIC METABOLIC PANEL
BUN: 11 mg/dL (ref 6–23)
Calcium: 8.9 mg/dL (ref 8.4–10.5)
GFR calc Af Amer: 73 mL/min — ABNORMAL LOW (ref >90)
GFR calc non Af Amer: 63 mL/min — ABNORMAL LOW (ref >90)
Potassium: 4.4 mEq/L (ref 3.5–5.1)
Sodium: 137 mEq/L (ref 135–145)

## 2013-06-03 LAB — CBC
Hemoglobin: 14.4 g/dL (ref 13.0–17.0)
MCHC: 35 g/dL (ref 30.0–36.0)
Platelets: 123 10*3/uL — ABNORMAL LOW (ref 150–400)
RBC: 4.73 MIL/uL (ref 4.22–5.81)

## 2013-06-03 MED ORDER — CARVEDILOL 12.5 MG PO TABS: 12.5000 mg | ORAL_TABLET | Freq: Two times a day (BID) | ORAL | Status: DC

## 2013-06-03 MED ORDER — CARVEDILOL 12.5 MG PO TABS
12.5000 mg | ORAL_TABLET | Freq: Two times a day (BID) | ORAL | Status: DC
  Administered 2013-06-03: 10:00:00 12.5 mg via ORAL

## 2013-06-03 NOTE — Progress Notes (Signed)
[facility]  Subjective: Denies groin, back or flank pain. No bleeding from cath site. He ambulated last PM with little difficulty. No chest pain.   Objective: Vital signs in last 24 hours: Temp:  [97.8 F (36.6 C)-99.5 F (37.5 C)] 99 F (37.2 C) (07/01 0842) Pulse Rate:  [51-85] 81 (07/01 0842) Resp:  [18-20] 20 (07/01 0842) BP: (74-136)/(36-78) 88/50 mmHg (07/01 0842) SpO2:  [91 %-100 %] 95 % (07/01 0842) Weight:  [212 lb 4.9 oz (96.3 kg)] 212 lb 4.9 oz (96.3 kg) (07/01 0059) Last BM Date: 06/01/13  Intake/Output from previous day: 06/30 0701 - 07/01 0700 In: 395 [P.O.:320; I.V.:75] Out: 1050 [Urine:1050] Intake/Output this shift:    Medications Current Facility-Administered Medications  Medication Dose Route Frequency Provider Last Rate Last Dose  . acetaminophen (TYLENOL) tablet 650 mg  650 mg Oral Q4H PRN Runell Gess, MD      . amiodarone (PACERONE) tablet 200 mg  200 mg Oral BID Runell Gess, MD   200 mg at 06/02/13 2009  . aspirin EC tablet 81 mg  81 mg Oral Daily Runell Gess, MD      . atorvastatin (LIPITOR) tablet 80 mg  80 mg Oral q1800 Runell Gess, MD      . carvedilol (COREG) tablet 18.75 mg  18.75 mg Oral BID WC Runell Gess, MD   18.75 mg at 06/02/13 1900  . diphenhydrAMINE (BENADRYL) capsule 25 mg  25 mg Oral Q6H PRN Runell Gess, MD      . famotidine (PEPCID) tablet 20 mg  20 mg Oral Daily Runell Gess, MD      . ibuprofen (ADVIL,MOTRIN) tablet 400 mg  400 mg Oral Daily PRN Runell Gess, MD      . isosorbide mononitrate (IMDUR) 24 hr tablet 30 mg  30 mg Oral Daily Runell Gess, MD      . magnesium oxide (MAG-OX) tablet 400 mg  400 mg Oral BID Runell Gess, MD   400 mg at 06/02/13 2009  . morphine 2 MG/ML injection 1 mg  1 mg Intravenous Q1H PRN Runell Gess, MD      . ondansetron [facility]) injection 4 mg  4 mg Intravenous Q6H PRN Runell Gess, MD      . prasugrel (EFFIENT) tablet  10 mg  10 mg Oral Daily Runell Gess, MD        PE: General appearance: alert, cooperative and no distress Lungs: clear to auscultation bilaterally Heart: regular rate and rhythm, S1, S2 normal, no murmur, click, rub or gallop Extremities: no LEE, right groin: soft, non tender, no drainage, no ecchymosis, no bruit Pulses: 2+ radials, 2+ right DP, 1+ left DP Skin: warm and dry Neurologic: Grossly normal  Lab Results:   Recent Labs  06/02/13 0622 06/03/13 0455  WBC 6.2 11.0*  HGB 13.7 14.4  HCT 39.8 41.1  PLT 124* 123*   BMET  Recent Labs  06/02/13 0622 06/03/13 0455  NA 139 137  K 3.8 4.4  CL 106 104  CO2 26 23  GLUCOSE 98 113*  BUN 9 11  CREATININE 1.11 1.33  CALCIUM 9.1 8.9   PT/INR  Recent Labs  06/02/13 0622  LABPROT 12.4  INR 0.94    Studies/Results:  PV Angiogram 06/02/13 HEMODYNAMICS:  AO SYSTOLIC/AO DIASTOLIC: 115/64  ANGIOGRAPHIC RESULTS:  1: Angiography revealed a 80-90% proximal right common iliac artery stenosis.  Procedure description:the 5 [place] sheath  was exchanged over a 35 Versicore wire for a 6 French Bright tip sheath. Stenting was performed with an 8 mm x 24 mm long Cordis Genesis balloon expandable stent deployed at 10 atmospheres resulting in reduction of an 80% proximal right common iliac artery stenosis to 0% residual.  Assessment/Plan  Principal Problem:   Peripheral artery disease -s/p PTA + stenting of right CIA 06/02/13  Plan: S/P successful PTA and stenting of a high-grade right common iliac artery stenosis with balloon expandable stent. POD#1. Right groin is stable, soft and w/o ecchymosis or bruit. No groin, back or flank pain. No difficulty ambulating. Labs WNL, including H/H and SCr. He is however hypotensive, with most recent BP of 88/50. He is currently asymptomatic. Will need to hold Coreg and Imdur for now. His current dose of Coreg is 18.75 mg BID. Consider decreasing prior to discharge. If BP is stable and patient  has no symptoms, plan for possible discharge later today. Right LEA doppler is scheduled for 7/14. He will follow-up with Dr. Allyson Sabal on 7/24.     LOS: 1 day    Brittainy M. Delmer Islam 06/03/2013  Agree with note written by Boyce Medici  PAC  S/P RCIA PTA/Stent. Feels clinically improved. Labs OK. Exam benign. OK for D/C home on current meds (adjust coreg secondary to hypotension). LEA the ROV with me.   Loda Bialas J 06/03/2013 10:06 AM  8:51 AM

## 2013-06-03 NOTE — Discharge Summary (Signed)
Physician Discharge Summary  Patient ID: Ricky Roberts MRN: [id number] DOB/AGE: Dec 08, 1965 47 y.o.  Admit date: 06/02/2013 Discharge date: 06/03/2013  Admission Diagnoses: Peripheral Artery Disease with lifestyle limiting claudication of the right lower extremity  Discharge Diagnoses:  Principal Problem:   Peripheral artery disease -s/p PTA + stenting of right CIA 06/02/13   Discharged Condition: stable  Hospital Course:  The patient is a 47 year old African American male with a history of coronary artery disease and was recently hospitalized from March 29, 2013, until April 02, 2013, with an ST-elevation myocardial infarction of the inferolateral wall, as well as cardiac arrest secondary to torsades, requiring 4 shocks by EMS. He had a coronary artery stent placement to the left circumflex emergently, and then angioplasty with a stent on April 28 to the RV ostial marginal with a drug-eluting Xience Xpedition stent. He also had problems with NSVT. He was treated with aspirin and Brilinta as well as amiodarone. He was subsequently rehospitalized from Apr 05, 2013, to Apr 08, 2013, with a diffuse rash that was likely secondary to [place]. He was changed to Effient 10 mg a day and treated with Benadryl and prednisone taper. During the initial hospitalization the patient was found to have a high-grade right common iliac artery stenosis and complained of right lower extremity lifestyle limiting claudication. Dopplers were consistent with these findings. The patient presented back to [facility] on 6/30 for angiography and intervention of his right common iliac artery. The procedure was performed by Dr. Allyson Sabal. He underwent successful PTA and stenting of a high-grade right common iliac artery stenosis with a balloon expandable stent. He tolerated the procedure well. He left the cath lab in stable condition. His right femoral access site remained stable. He had no pain with ambulation. His labs were within normal limits.  The patient did, however, have some hypotension with systolic blood pressures in the upper 80s and low 90s. However, he remained asymptomatic. The patient had reported that Dr. Tresa Endo recently increased the dose of his Coreg, but han't had any problems with the increased dose. Dr. Allyson Sabal decided to decrease his dose to 12.5 mg and his blood pressure improved on the decreased dose. The patient was last seen an examined by Dr. Allyson Sabal, who determined him to be stable for discharge home. A right lower extremity arterial doppler has been ordered for 7/14. He will follow-up with Dr. Allyson Sabal on 7/24. The patient has been instructed to check his blood pressure regualry at home and to contact [facility] if he develops any symptoms with hypotension.   Consults: None  Significant Diagnostic Studies:   PV angio 06/02/13 HEMODYNAMICS:  AO SYSTOLIC/AO DIASTOLIC: 115/64  ANGIOGRAPHIC RESULTS:  1: Angiography revealed a 80-90% proximal right common iliac artery stenosis.  Procedure description:the 5 French sheath was exchanged over a 35 Versicore wire for a 6 French Bright tip sheath. Stenting was performed with an 8 mm x 24 mm long Cordis Genesis balloon expandable stent deployed at 10 atmospheres resulting in reduction of an 80% proximal right common iliac artery stenosis to 0% residual.   Treatments: See Hospital Course  Discharge Exam: Blood pressure 88/50, pulse 81, temperature 99 F (37.2 C), temperature source Oral, resp. rate 20, height 5\' 10"  (1.778 m), weight 212 lb 4.9 oz (96.3 kg), SpO2 95.00%.   Disposition: 01-Home or Self Care   Future Appointments Provider Department Dept Phone   06/16/2013 12:30 PM [facility] [place] [facility] [address] [phone]   06/26/2013 9:30 AM Delton See  Allyson Sabal, MD [facility] [place] [phone]   06/27/2013 9:30 AM Lennette Bihari, MD [facility] [place] [phone]       Medication  List    ASK your doctor about these medications       amiodarone 200 MG tablet  Commonly known as:  PACERONE  Take 1 tablet (200 mg total) by mouth 2 (two) times daily.     aspirin 81 MG EC tablet  Take 1 tablet (81 mg total) by mouth daily.     atorvastatin 80 MG tablet  Commonly known as:  LIPITOR  Take 1 tablet (80 mg total) by mouth daily at 6 PM.     carvedilol 12.5 MG tablet  Commonly known as:  COREG  Take 18.75 mg by mouth 2 (two) times daily with a meal.     diphenhydrAMINE 25 mg capsule  Commonly known as:  BENADRYL  Take 1 capsule (25 mg total) by mouth every 6 (six) hours as needed for itching.     famotidine 20 MG tablet  Commonly known as:  PEPCID  Take 1 tablet (20 mg total) by mouth daily.     ibuprofen 200 MG tablet  Commonly known as:  ADVIL,MOTRIN  Take 400 mg by mouth daily as needed for pain.     isosorbide mononitrate 30 MG 24 hr tablet  Commonly known as:  IMDUR  Take 1 tablet (30 mg total) by mouth daily.     loratadine 10 MG tablet  Commonly known as:  CLARITIN  Take 1 tablet (10 mg total) by mouth daily.     magnesium oxide 400 (241.3 MG) MG tablet  Commonly known as:  MAG-OX  Take 1 tablet (400 mg total) by mouth 2 (two) times daily.     prasugrel 10 MG Tabs  Commonly known as:  EFFIENT  Take 1 tablet (10 mg total) by mouth daily.           Follow-up Information   Follow up with [facility] On 06/16/2013. ( for ultasound of right leg 12:30 pm )    Contact information:   [phone]      Follow up with Runell Gess, MD On 06/26/2013. (9:30 am)    Contact information:   [address] [place] [address] [phone]       Follow up with Lennette Bihari, MD On 06/27/2013. (9:30 am)    Contact information:   [address] [place] [address] [phone]      TIME SPENT ON DISCHARGE, INCLUDING PHYSICIAN TIME: > 30 MINUTES  Signed: Allayne Butcher, PA-C 06/03/2013,  9:33 AM

## 2013-06-05 ENCOUNTER — Encounter (HOSPITAL_COMMUNITY): Payer: BC Managed Care – PPO

## 2013-06-16 ENCOUNTER — Ambulatory Visit (HOSPITAL_COMMUNITY)
Admission: RE | Admit: 2013-06-16 | Discharge: 2013-06-16 | Disposition: A | Discharge status: Home / Self Care | Payer: BC Managed Care – PPO | Source: Ambulatory Visit | Attending: Cardiovascular Disease | Admitting: Cardiovascular Disease

## 2013-06-16 DIAGNOSIS — I739 Peripheral vascular disease, unspecified: Secondary | ICD-10-CM | POA: Insufficient documentation

## 2013-06-16 NOTE — Progress Notes (Signed)
Right Lower Extremity Arterial Duplex Completed. Ricky Roberts

## 2013-06-17 ENCOUNTER — Telehealth: Payer: Self-pay | Admitting: Cardiovascular Disease

## 2013-06-17 NOTE — Telephone Encounter (Signed)
Left message for patient to call back and confirm which physician he is going to see next week. There are 2 appts in the system.

## 2013-06-18 ENCOUNTER — Encounter: Payer: Self-pay | Admitting: Cardiovascular Disease

## 2013-06-18 NOTE — Progress Notes (Signed)
Patient ID: Ricky Roberts, male   DOB: [birth year], 47 y.o.   MRN: [id number]     HPI: Ricky Roberts, is a 47 y.o. male would develop an acute coronary syndrome/ST segment elevation myocardial infarction inferolaterally as well as cardiac arrest secondary to torsade du pointes requiring defibrillation on 03/29/2013.  He was taken emergently to the cardiac catheterization laboratory by me and found  to have a totally occluded proximal circumflex and 85-90% stenosis in his mid right coronary artery. He underwent successful intervention to his circumflex vessel after undergoing thrombectomy which removed significant thrombus. A Xience Xpedition 3.0x15 mm DES stent was inserted which was postdilated 3.3 mm. Incidentally, he was also noted to have a 90% stenosis in his distal circumflex which underwent PTCA and was reduced to less than 30%. There also is mild diseases in the OM1 vessel a 50 and 30%.  He left the laboratory with TIMI 3 flow. 2 days later he returned to the laboratory where his circumflex stent was found to be widely patent. At that time he underwent intervention to his right coronary artery was angiosculpt cutting balloon, and ultimate stenting with a 3.25x18 mm signs DES stent postdilated to 3.5 mm the 85-90% stenosis reduced to percent. He also underwent PTCA of the acute marginal branch through the stent strut which improved from 95% to less than 40%. He denies any recurrent chest pain. He presents to the office today for evaluation after he had his initial post hospital followup evaluation on 04/09/2013 by Neysa Bonito. Of note, the patient was rehospitalized from a 3 to may 6 with diffuse rash that was felt to be secondary to [place]. He ultimately was changed to caffeine 10 mg daily and was treated with Benadryl and prednisone taper. He also has an allergy to lisinopril which also cause a rash.  Past Medical History  Diagnosis Date  . Cardiac arrest, secondary to torsodes with 4 shocks by EMS   03/31/2013  . CAD (coronary artery disease), residual RCA stenosis with recurrent angina 03/31/2013  . S/P angioplasty with stent 03/31/13 to RV ostial marginal with DES Xience Xpedition stent 04/01/2013  . Peripheral vascular disease with claudication     90% ostial right common iliac artery stenosis discovered at the time of cardiac catheterization with an abnormal Doppler study  . Acute MI     2D ECHO, 03/31/2013 - EF 40-45%. normal    Past Surgical History  Procedure Laterality Date  . Fracture surgery      both ankles as a child  . Coronary stent placement  4/27-28/2013    DES to LCx (STEMI culprit); staged PCI to RCA for post STEMI Angina    Allergies  Allergen Reactions  . Lisinopril Rash    rash  . Ticagrelor Rash    Current Outpatient Prescriptions  Medication Sig Dispense Refill  . amiodarone (PACERONE) 200 MG tablet Take 1 tablet (200 mg total) by mouth 2 (two) times daily.  60 tablet  5  . aspirin EC 81 MG EC tablet Take 1 tablet (81 mg total) by mouth daily.      Marland Kitchen atorvastatin (LIPITOR) 80 MG tablet Take 1 tablet (80 mg total) by mouth daily at 6 PM.  30 tablet  5  . diphenhydrAMINE (BENADRYL) 25 mg capsule Take 1 capsule (25 mg total) by mouth every 6 (six) hours as needed for itching.  30 capsule  0  . famotidine (PEPCID) 20 MG tablet Take 1 tablet (20 mg total) by mouth daily.  30 tablet  6  . ibuprofen (ADVIL,MOTRIN) 200 MG tablet Take 400 mg by mouth daily as needed for pain.      Marland Kitchen loratadine (CLARITIN) 10 MG tablet Take 1 tablet (10 mg total) by mouth daily.  30 tablet  0  . magnesium oxide (MAG-OX) 400 (241.3 MG) MG tablet Take 1 tablet (400 mg total) by mouth 2 (two) times daily.  60 tablet  5  . prasugrel (EFFIENT) 10 MG TABS Take 1 tablet (10 mg total) by mouth daily.  30 tablet  6  . carvedilol (COREG) 12.5 MG tablet Take 1 tablet (12.5 mg total) by mouth 2 (two) times daily with a meal.  30 tablet  5  . isosorbide mononitrate (IMDUR) 30 MG 24 hr tablet Take 1  tablet (30 mg total) by mouth daily.  30 tablet  6   No current facility-administered medications for this visit.    Socially he is married. One child. He does exercise with walking. There is remote tobacco history having quit in April 2013. He does drink alcohol. He did smoke occasional marijuana.  ROS is negative for fevers, chills or night sweats.   Other system review is negative.  PE BP 122/84  Pulse 62  Ht 5\' 10"  (1.778 m)  Wt 210 lb 1.6 oz (95.301 kg)  BMI 30.15 kg/m2  General: Alert, oriented, no distress.  Skin: normal turgor, no rashes HEENT: Normocephalic, atraumatic. Pupils round and reactive; sclera anicteric;no lid lag.  Nose without nasal septal hypertrophy Mouth/Parynx benign; Mallinpatti scale** Neck: No JVD, no carotid briuts Lungs: clear to ausculatation and percussion; no wheezing or rales Heart: RRR, s1 s2 normal  Abdomen: soft, nontender; no hepatosplenomehaly, BS+; abdominal aorta nontender and not dilated by palpation. Pulses 2+ Extremities: no clubbing cyanosis or edema, Homan's sign negative  Neurologic: grossly nonfocal  ECG: Probable low atrial rhythm and evidence for probable posterior MI with early transition and prominent R wave in V2. PR interval 172 ms, QTc interval 460 ms.  LABS:  BMET    Component Value Date/Time   NA 137 06/03/2013 0455   K 4.4 06/03/2013 0455   CL 104 06/03/2013 0455   CO2 23 06/03/2013 0455   GLUCOSE 113* 06/03/2013 0455   BUN 11 06/03/2013 0455   CREATININE 1.33 06/03/2013 0455   CREATININE 1.23 05/09/2013 0810   CALCIUM 8.9 06/03/2013 0455   GFRNONAA 63* 06/03/2013 0455   GFRAA 73* 06/03/2013 0455     Hepatic Function Panel     Component Value Date/Time   PROT 7.3 04/05/2013 0615   ALBUMIN 3.4* 04/05/2013 0615   AST 34 04/05/2013 0615   ALT 51 04/05/2013 0615   ALKPHOS 67 04/05/2013 0615   BILITOT 0.8 04/05/2013 0615   BILIDIR 0.3 04/05/2013 0615   IBILI 0.5 04/05/2013 0615     CBC    Component Value Date/Time   WBC 11.0* 06/03/2013  0455   RBC 4.73 06/03/2013 0455   HGB 14.4 06/03/2013 0455   HCT 41.1 06/03/2013 0455   PLT 123* 06/03/2013 0455   MCV 86.9 06/03/2013 0455   MCH 30.4 06/03/2013 0455   MCHC 35.0 06/03/2013 0455   RDW 13.7 06/03/2013 0455   LYMPHSABS 0.7 04/05/2013 0057   MONOABS 0.8 04/05/2013 0057   EOSABS 0.1 04/05/2013 0057   BASOSABS 0.1 04/05/2013 0057     BNP No results found for this basename: probnp    Lipid Panel     Component Value Date/Time   CHOL 172 04/01/2013  0503   TRIG 152* 04/01/2013 0503   HDL 45 04/01/2013 0503   CHOLHDL 3.8 04/01/2013 0503   VLDL 30 04/01/2013 0503   LDLCALC 97 04/01/2013 0503     RADIOLOGY: No results found.    ASSESSMENT AND PLAN Mr. Pfost is a 47 year old [place] American gentleman who developed an T. segment elevation myocardial infarction on April 26 and presented with cardiac arrest/sudden points acquiring success of the fibrillation and ultimate acute intervention with reopening of a totally occluded circumflex vessel and PTCA of his distal vessel. He had concomitant CAD with high-grade stenosis in his mid right coronary artery and 2 days later underwent successful staged intervention to his mid RCA as well as his marginal branch. He developed a rash secondary to Dilantin but seems to be tolerating FE and. He apparently also developed arrest secondary to lisinopril he is now on carvedilol high dose lipid lowering therapy, amiodarone, in addition to Dual anti-platelet therapy.I will attempt to reduce his amiodarone dosing. His arrythmia occurred in the acute coronary syndrome setting and I suspect that this will be able to be weaned and discontinued completely. Repeat laboratory will be obtained and  will include NMR Lipoprofile to allow for optimal treatment and optimization of LDL particle number. Presently he is stable. I do feel now he can undergo percutaneous cardiac intervention to his high-grade 90% iliac stenosis which was noted on his initial catheterization and he will  refer to Dr. Allyson Sabal for this percutaneous peripheral intervention.     Lennette Bihari, MD, [facility]  06/18/2013 10:46 PM

## 2013-06-25 ENCOUNTER — Encounter: Payer: Self-pay | Admitting: Cardiovascular Disease

## 2013-06-26 ENCOUNTER — Ambulatory Visit (INDEPENDENT_AMBULATORY_CARE_PROVIDER_SITE_OTHER): Payer: BC Managed Care – PPO | Admitting: Cardiovascular Disease

## 2013-06-26 ENCOUNTER — Encounter: Payer: Self-pay | Admitting: Cardiovascular Disease

## 2013-06-26 VITALS — BP 98/62 | HR 54 | Ht 70.0 in | Wt 209.0 lb

## 2013-06-26 DIAGNOSIS — I219 Acute myocardial infarction, unspecified: Secondary | ICD-10-CM

## 2013-06-26 DIAGNOSIS — Z9861 Coronary angioplasty status: Secondary | ICD-10-CM

## 2013-06-26 DIAGNOSIS — I739 Peripheral vascular disease, unspecified: Secondary | ICD-10-CM

## 2013-06-26 DIAGNOSIS — I2510 Atherosclerotic heart disease of native coronary artery without angina pectoris: Secondary | ICD-10-CM

## 2013-06-26 DIAGNOSIS — I213 ST elevation (STEMI) myocardial infarction of unspecified site: Secondary | ICD-10-CM

## 2013-06-26 DIAGNOSIS — Z955 Presence of coronary angioplasty implant and graft: Secondary | ICD-10-CM

## 2013-06-26 DIAGNOSIS — Z9889 Other specified postprocedural states: Secondary | ICD-10-CM

## 2013-06-26 DIAGNOSIS — Z95820 Peripheral vascular angioplasty status with implants and grafts: Secondary | ICD-10-CM

## 2013-06-26 NOTE — Patient Instructions (Signed)
  We will see you back in follow up only as needed by Dr Allyson Sabal  Dr Allyson Sabal has ordered lower extremity dopplers every 6 months

## 2013-06-26 NOTE — Progress Notes (Signed)
06/26/2013 Ricky Roberts   Mar 02, 1966  [id number]  Primary Physician Gabriel Cirri, DO Primary Cardiologist: Ricky Roberts Roseanne Reno   HPI:  The patient is a 47 year old African American male with history of coronary artery disease and was recently hospitalized from March 29, 2013, until April 02, 2013, with an ST-elevation myocardial infarction on the inferolateral wall, as well as cardiac arrest secondary to torsades, requiring 4 shocks by EMS. He had a coronary artery stent placement to the left circumflex emergently, and then angioplasty with stent on April 28 to the RV ostial marginal with a drug-eluting Xience Xpedition stent. He also had problems with NSVT. He was treated with aspirin and Brilinta as well as amiodarone. He was subsequently rehospitalized from Apr 05, 2013, to Apr 08, 2013, with a diffuse rash that was likely secondary to [place]. He was changed to Effient 10 mg a day and treated with Benadryl and prednisone taper. During the initial hospitalization the patient was found to have a high-grade right common iliac artery stenosis. He does complain of right lower extremity lifestyle limiting claudication. Dopplers were consistent with these findings. The patient underwent peripheral angiography, PTCA and stenting of his right common iliac artery by myself 06/02/13 with an 8 mm x 24 mm long Cordis Genesis balloon-expandable stent with an excellent angiographic result. He no longer has claudication. His followup Dopplers revealed a right ABI of 0.91.     Current Outpatient Prescriptions  Medication Sig Dispense Refill  . amiodarone (PACERONE) 200 MG tablet Take 1 tablet (200 mg total) by mouth 2 (two) times daily.  60 tablet  5  . aspirin EC 81 MG EC tablet Take 1 tablet (81 mg total) by mouth daily.      Marland Kitchen atorvastatin (LIPITOR) 80 MG tablet Take 1 tablet (80 mg total) by mouth daily at 6 PM.  30 tablet  5  . carvedilol (COREG) 12.5 MG tablet Take 1 tablet (12.5  mg total) by mouth 2 (two) times daily with a meal.  30 tablet  5  . famotidine (PEPCID) 20 MG tablet Take 1 tablet (20 mg total) by mouth daily.  30 tablet  6  . ibuprofen (ADVIL,MOTRIN) 200 MG tablet Take 400 mg by mouth daily as needed for pain.      . isosorbide mononitrate (IMDUR) 30 MG 24 hr tablet Take 1 tablet (30 mg total) by mouth daily.  30 tablet  6  . loratadine (CLARITIN) 10 MG tablet Take 1 tablet (10 mg total) by mouth daily.  30 tablet  0  . magnesium oxide (MAG-OX) 400 (241.3 MG) MG tablet Take 1 tablet (400 mg total) by mouth 2 (two) times daily.  60 tablet  5  . prasugrel (EFFIENT) 10 MG TABS Take 1 tablet (10 mg total) by mouth daily.  30 tablet  6   No current facility-administered medications for this visit.    Allergies  Allergen Reactions  . Lisinopril Rash    rash  . Ticagrelor Rash    History   Social History  . Marital Status: Married    Spouse Name: N/A    Number of Children: N/A  . Years of Education: N/A   Occupational History  . Not on file.   Social History Main Topics  . Smoking status: Former Smoker -- 23 years    Types: Cigarettes    Quit date: 03/04/2012  . Smokeless tobacco: Not on file  . Alcohol Use: 3.0 oz/week    5 Cans of beer  per week     Comment: 5-6 cans of beer daily  . Drug Use: Yes    Special: Other-see comments  . Sexually Active: Yes   Other Topics Concern  . Not on file   Social History Narrative  . No narrative on file     Review of Systems: General: negative for chills, fever, night sweats or weight changes.  Cardiovascular: negative for chest pain, dyspnea on exertion, edema, orthopnea, palpitations, paroxysmal nocturnal dyspnea or shortness of breath Dermatological: negative for rash Respiratory: negative for cough or wheezing Urologic: negative for hematuria Abdominal: negative for nausea, vomiting, diarrhea, bright red blood per rectum, melena, or hematemesis Neurologic: negative for visual changes,  syncope, or dizziness All other systems reviewed and are otherwise negative except as noted above.    Blood pressure 98/62, pulse 54, height 5\' 10"  (1.778 m), weight 209 lb (94.802 kg).  General appearance: alert and no distress Neck: no adenopathy, no carotid bruit, no JVD, supple, symmetrical, trachea midline and thyroid not enlarged, symmetric, no tenderness/mass/nodules Lungs: normal percussion bilaterally Heart: regular rate and rhythm, S1, S2 normal, no murmur, click, rub or gallop Extremities: extremities normal, atraumatic, no cyanosis or edema  EKG sinus bradycardia at 54 without ST or T wave changes  ASSESSMENT AND PLAN:   Peripheral artery disease -s/p PTA + stenting of right CIA 06/02/13 I performed PTA and stenting of his right common iliac artery on 06/02/13 with a 8 mm x 24 mm long Cordis Genesis balloon expandable stent with an excellent angiographic result. Followup Dopplers were markedly improved as well. Patient no longer has claudication. He is on dual antiplatelet therapy for his heart as well. His right ABI is now 0.91.      Ricky Roberts [facility], [facility] 06/26/2013 10:39 AM

## 2013-06-26 NOTE — Assessment & Plan Note (Signed)
I performed PTA and stenting of his right common iliac artery on 06/02/13 with a 8 mm x 24 mm long Cordis Genesis balloon expandable stent with an excellent angiographic result. Followup Dopplers were markedly improved as well. Patient no longer has claudication. He is on dual antiplatelet therapy for his heart as well. His right ABI is now 0.91.

## 2013-06-27 ENCOUNTER — Ambulatory Visit (INDEPENDENT_AMBULATORY_CARE_PROVIDER_SITE_OTHER): Payer: BC Managed Care – PPO | Admitting: Cardiovascular Disease

## 2013-06-27 VITALS — BP 100/60 | HR 64 | Ht 70.0 in | Wt 209.4 lb

## 2013-06-27 DIAGNOSIS — I2510 Atherosclerotic heart disease of native coronary artery without angina pectoris: Secondary | ICD-10-CM

## 2013-06-27 DIAGNOSIS — I739 Peripheral vascular disease, unspecified: Secondary | ICD-10-CM

## 2013-06-27 DIAGNOSIS — I469 Cardiac arrest, cause unspecified: Secondary | ICD-10-CM

## 2013-06-27 DIAGNOSIS — I213 ST elevation (STEMI) myocardial infarction of unspecified site: Secondary | ICD-10-CM

## 2013-06-27 DIAGNOSIS — I219 Acute myocardial infarction, unspecified: Secondary | ICD-10-CM

## 2013-06-27 DIAGNOSIS — Z95820 Peripheral vascular angioplasty status with implants and grafts: Secondary | ICD-10-CM

## 2013-06-27 DIAGNOSIS — Z9861 Coronary angioplasty status: Secondary | ICD-10-CM

## 2013-06-27 DIAGNOSIS — Z9889 Other specified postprocedural states: Secondary | ICD-10-CM

## 2013-06-27 DIAGNOSIS — Z955 Presence of coronary angioplasty implant and graft: Secondary | ICD-10-CM

## 2013-06-27 NOTE — Patient Instructions (Addendum)
Your physician recommends that you schedule a follow-up appointment in: 3 months  Your physician has recommended you make the following change in your medication: Decrease Amiodarone from 400 mg to 300 mg for 1 month then 200 mg daily

## 2013-07-03 ENCOUNTER — Encounter (HOSPITAL_COMMUNITY)
Admission: RE | Admit: 2013-07-03 | Discharge: 2013-07-03 | Disposition: A | Discharge status: Home / Self Care | Payer: BC Managed Care – PPO | Source: Ambulatory Visit | Attending: Cardiovascular Disease | Admitting: Cardiovascular Disease

## 2013-07-03 NOTE — Progress Notes (Signed)
Cardiac Rehab Medication Review by a Pharmacist  Does the patient  feel that his/her medications are working for him/her?  yes  Has the patient been experiencing any side effects to the medications prescribed?  no  Does the patient measure his/her own blood pressure or blood glucose at home?  yes   Does the patient have any problems obtaining medications due to transportation or finances?   no  Understanding of regimen: good Understanding of indications: good Potential of compliance: good    Pharmacist comments: 47 yo pleasant man bringing list of medications currently taking. Generally well aware of indications and importance of adherence. Addressed his desire to come off of medications by reinforcing importance and that with proper follow up and adherence will likely be able to eventually come off of some of his medications although some will likely be lifelong. Was agreeable and understanding of this.    Margie Billet, PharmD Clinical Pharmacist - Resident Pager: [phone] Pharmacy: [phone] 07/03/2013 8:55 AM

## 2013-07-06 ENCOUNTER — Encounter: Payer: Self-pay | Admitting: Cardiovascular Disease

## 2013-07-06 NOTE — Progress Notes (Signed)
Patient ID: Ricky Roberts, male   DOB: [birth year], 47 y.o.   MRN: [id number]     HPI: Ricky Roberts, is a 47 y.o. male would develop an acute coronary syndrome/ST segment elevation myocardial infarction inferolaterally as well as cardiac arrest secondary to torsade du pointes requiring defibrillation on 03/29/2013.  He was taken emergently to the cardiac catheterization laboratory by me and found  to have a totally occluded proximal circumflex and 85-90% stenosis in his mid right coronary artery. He underwent successful intervention to his circumflex vessel after undergoing thrombectomy which removed significant thrombus. A Xience Xpedition 3.0x15 mm DES stent was inserted which was postdilated 3.3 mm. Incidentally, he was also noted to have a 90% stenosis in his distal circumflex which underwent PTCA and was reduced to less than 30%. There also is mild diseases in the OM1 vessel a 50 and 30%.  He left the laboratory with TIMI 3 flow. 2 days later he returned to the laboratory where his circumflex stent was found to be widely patent. At that time he underwent intervention to his right coronary artery was angiosculpt cutting balloon, and ultimate stenting with a 3.25x18 mm signs DES stent postdilated to 3.5 mm the 85-90% stenosis reduced to percent. He also underwent PTCA of the acute marginal branch through the stent strut which improved from 95% to less than 40%. He developed a rash which was felt possibly due to Brilinta and had intolerance to lisinopril. Nuclear perfusion study on 05/09/2013 showed reduced LV function with severe hypokinesis to akinesis in the inferior temporal lateral collateral region consistent with his prior infarction. Since I last saw him, he subsequently underwent peripheral intervention by Dr. Allyson Sabal to his right common iliac artery on 06/02/2013. He has felt improved and no longer experiences claudication. He denies any arrhythmias. He denies palpitations. He denies recurrent chest pain.  He presents now for followup cardiology evaluation.  Past Medical History  Diagnosis Date  . Cardiac arrest, secondary to torsodes with 4 shocks by EMS  03/31/2013  . CAD (coronary artery disease), residual RCA stenosis with recurrent angina 03/31/2013  . S/P angioplasty with stent 03/31/13 to RV ostial marginal with DES Xience Xpedition stent 04/01/2013  . Peripheral vascular disease with claudication     90% ostial right common iliac artery stenosis discovered at the time of cardiac catheterization with an abnormal Doppler study  . Acute MI     2D ECHO, 03/31/2013 - EF 40-45%. normal    Past Surgical History  Procedure Laterality Date  . Fracture surgery      both ankles as a child  . Coronary stent placement  4/27-28/2013    DES to LCx (STEMI culprit); staged PCI to RCA for post STEMI Angina    Allergies  Allergen Reactions  . Lisinopril Rash    rash  . Ticagrelor Rash    Current Outpatient Prescriptions  Medication Sig Dispense Refill  . amiodarone (PACERONE) 200 MG tablet 300 mg for 1 month then 200 mg daily      . aspirin EC 81 MG EC tablet Take 1 tablet (81 mg total) by mouth daily.      Marland Kitchen atorvastatin (LIPITOR) 80 MG tablet Take 1 tablet (80 mg total) by mouth daily at 6 PM.  30 tablet  5  . carvedilol (COREG) 12.5 MG tablet Take 1 tablet (12.5 mg total) by mouth 2 (two) times daily with a meal.  30 tablet  5  . famotidine (PEPCID) 20 MG tablet Take 1 tablet (20  mg total) by mouth daily.  30 tablet  6  . ibuprofen (ADVIL,MOTRIN) 200 MG tablet Take 400 mg by mouth daily as needed for pain.      . isosorbide mononitrate (IMDUR) 30 MG 24 hr tablet Take 1 tablet (30 mg total) by mouth daily.  30 tablet  6  . loratadine (CLARITIN) 10 MG tablet Take 1 tablet (10 mg total) by mouth daily.  30 tablet  0  . magnesium oxide (MAG-OX) 400 (241.3 MG) MG tablet Take 1 tablet (400 mg total) by mouth 2 (two) times daily.  60 tablet  5  . prasugrel (EFFIENT) 10 MG TABS Take 1 tablet (10 mg  total) by mouth daily.  30 tablet  6   No current facility-administered medications for this visit.    Socially he is married. One child. He does exercise with walking. There is remote tobacco history having quit in April 2013. He does drink alcohol. He did smoke occasional marijuana.  ROS is negative for fevers, chills or night sweats.  He denies any further rash. He denies recurrent chest pain. There are no palpitations. There is no presyncope or syncope. He denies bleeding. He denies abdominal pain. Claudication has resolved following his PCI to his right common iliac artery. Is no edema.  Other system review is negative.  PE BP 100/60  Pulse 64  Ht 5\' 10"  (1.778 m)  Wt 209 lb 6.4 oz (94.983 kg)  BMI 30.05 kg/m2  General: Alert, oriented, no distress.  Skin: normal turgor, no rashes HEENT: Normocephalic, atraumatic. Pupils round and reactive; sclera anicteric;no lid lag.  Nose without nasal septal hypertrophy Mouth/Parynx benign; Mallinpatti scale  3 Neck: No JVD, no carotid briuts Lungs: clear to ausculatation and percussion; no wheezing or rales Heart: RRR, s1 s2 normal 1/6 sem Abdomen: soft, nontender; no hepatosplenomehaly, BS+; abdominal aorta nontender and not dilated by palpation. Pulses 2+ Extremities: no clubbing cyanosis or edema, Homan's sign negative  Neurologic: grossly nonfocal  ECG: Probable low atrial rhythm at 54,  posterior MI with early transition and prominent R wave in V2. PR interval 176 ms, QTc interval 474 ms.  LABS:  BMET    Component Value Date/Time   NA 137 06/03/2013 0455   K 4.4 06/03/2013 0455   CL 104 06/03/2013 0455   CO2 23 06/03/2013 0455   GLUCOSE 113* 06/03/2013 0455   BUN 11 06/03/2013 0455   CREATININE 1.33 06/03/2013 0455   CREATININE 1.23 05/09/2013 0810   CALCIUM 8.9 06/03/2013 0455   GFRNONAA 63* 06/03/2013 0455   GFRAA 73* 06/03/2013 0455     Hepatic Function Panel     Component Value Date/Time   PROT 7.3 04/05/2013 0615   ALBUMIN 3.4*  04/05/2013 0615   AST 34 04/05/2013 0615   ALT 51 04/05/2013 0615   ALKPHOS 67 04/05/2013 0615   BILITOT 0.8 04/05/2013 0615   BILIDIR 0.3 04/05/2013 0615   IBILI 0.5 04/05/2013 0615     CBC    Component Value Date/Time   WBC 11.0* 06/03/2013 0455   RBC 4.73 06/03/2013 0455   HGB 14.4 06/03/2013 0455   HCT 41.1 06/03/2013 0455   PLT 123* 06/03/2013 0455   MCV 86.9 06/03/2013 0455   MCH 30.4 06/03/2013 0455   MCHC 35.0 06/03/2013 0455   RDW 13.7 06/03/2013 0455   LYMPHSABS 0.7 04/05/2013 0057   MONOABS 0.8 04/05/2013 0057   EOSABS 0.1 04/05/2013 0057   BASOSABS 0.1 04/05/2013 0057     BNP No  results found for this basename: probnp    Lipid Panel     Component Value Date/Time   CHOL 172 04/01/2013 0503   TRIG 152* 04/01/2013 0503   HDL 45 04/01/2013 0503   CHOLHDL 3.8 04/01/2013 0503   VLDL 30 04/01/2013 0503   LDLCALC 97 04/01/2013 0503     RADIOLOGY: No results found.    ASSESSMENT AND PLAN: Ricky Roberts is a 47 year old African American gentleman who is status post a ST segment elevation myocardial infarction on 03/29/2013 secondary to circumflex occlusion acutely with VT/VF requiring successful defibrillation and PTCA/stenting of his circumflex vessel with subsequent staged intervention to his right coronary artery several days later on 03/31/2013. He has not had any arrhythmia since that time. He also was found to have high-grade right common iliac stenosis and is status post successful percutaneous coronary intervention for his peripheral vascular disease several weeks ago. I am now recommending a reduction of his amiodarone dose to 300 mg for the next month and then he will reduce this to 200 mg daily. His claudication has resolved. He's not having recurrent anginal symptoms. I will see him in 3 months for followup cardiology evaluation.  Ricky Bihari, MD, [facility]  07/06/2013 11:23 AM

## 2013-07-07 ENCOUNTER — Encounter (HOSPITAL_COMMUNITY)
Admission: RE | Admit: 2013-07-07 | Discharge: 2013-07-07 | Disposition: A | Discharge status: Home / Self Care | Payer: BC Managed Care – PPO | Source: Ambulatory Visit | Attending: Cardiovascular Disease | Admitting: Cardiovascular Disease

## 2013-07-07 DIAGNOSIS — Z9861 Coronary angioplasty status: Secondary | ICD-10-CM | POA: Insufficient documentation

## 2013-07-07 DIAGNOSIS — Z5189 Encounter for other specified aftercare: Secondary | ICD-10-CM | POA: Insufficient documentation

## 2013-07-07 DIAGNOSIS — I252 Old myocardial infarction: Secondary | ICD-10-CM | POA: Insufficient documentation

## 2013-07-07 NOTE — Progress Notes (Signed)
Pt started cardiac rehab today.  Pt tolerated light exercise without difficulty. VSS, telemetry-NSR.  Pt oriented to exercise equipment and routine.  Understanding verbalized.

## 2013-07-09 ENCOUNTER — Encounter (HOSPITAL_COMMUNITY): Payer: Self-pay

## 2013-07-09 ENCOUNTER — Encounter (HOSPITAL_COMMUNITY)
Admission: RE | Admit: 2013-07-09 | Discharge: 2013-07-09 | Disposition: A | Discharge status: Home / Self Care | Payer: BC Managed Care – PPO | Source: Ambulatory Visit | Attending: Cardiovascular Disease | Admitting: Cardiovascular Disease

## 2013-07-11 ENCOUNTER — Encounter (HOSPITAL_COMMUNITY)
Admission: RE | Admit: 2013-07-11 | Discharge: 2013-07-11 | Disposition: A | Discharge status: Home / Self Care | Payer: BC Managed Care – PPO | Source: Ambulatory Visit | Attending: Cardiovascular Disease | Admitting: Cardiovascular Disease

## 2013-07-11 ENCOUNTER — Other Ambulatory Visit: Payer: Self-pay | Admitting: *Deleted

## 2013-07-11 MED ORDER — ATORVASTATIN CALCIUM 80 MG PO TABS: 80.0000 mg | ORAL_TABLET | Freq: Every day | ORAL | Status: DC

## 2013-07-11 MED ORDER — PRASUGREL HCL 10 MG PO TABS: 10.0000 mg | ORAL_TABLET | Freq: Every day | ORAL | Status: DC

## 2013-07-11 MED ORDER — FAMOTIDINE 20 MG PO TABS: 20.0000 mg | ORAL_TABLET | Freq: Every day | ORAL | Status: DC

## 2013-07-11 MED ORDER — CARVEDILOL 12.5 MG PO TABS: 12.5000 mg | ORAL_TABLET | Freq: Two times a day (BID) | ORAL | Status: DC

## 2013-07-11 MED ORDER — AMIODARONE HCL 200 MG PO TABS: ORAL_TABLET | ORAL | Status: DC

## 2013-07-11 MED ORDER — ISOSORBIDE MONONITRATE ER 30 MG PO TB24: 30.0000 mg | ORAL_TABLET | Freq: Every day | ORAL | Status: DC

## 2013-07-11 NOTE — Progress Notes (Signed)
Reviewed home exercise with pt today.  Pt plans to walk at home and use a fitness center for exercise.  He will walk 2-4 days/week for 30-45 minutes on days outside of CRP II.  Reviewed THR, pulse, RPE, sign and symptoms, NTG use, and when to call 911 or MD.  Pt voiced understanding.  Alexia Freestone, MS, ACSM RCEP 11:13 AM

## 2013-07-14 ENCOUNTER — Other Ambulatory Visit: Payer: Self-pay | Admitting: *Deleted

## 2013-07-14 ENCOUNTER — Encounter (HOSPITAL_COMMUNITY)
Admission: RE | Admit: 2013-07-14 | Discharge: 2013-07-14 | Disposition: A | Discharge status: Home / Self Care | Payer: BC Managed Care – PPO | Source: Ambulatory Visit | Attending: Cardiovascular Disease | Admitting: Cardiovascular Disease

## 2013-07-16 ENCOUNTER — Encounter (HOSPITAL_COMMUNITY)
Admission: RE | Admit: 2013-07-16 | Discharge: 2013-07-16 | Disposition: A | Discharge status: Home / Self Care | Payer: BC Managed Care – PPO | Source: Ambulatory Visit | Attending: Cardiovascular Disease | Admitting: Cardiovascular Disease

## 2013-07-16 ENCOUNTER — Other Ambulatory Visit: Payer: Self-pay | Admitting: Cardiovascular Disease

## 2013-07-16 NOTE — Telephone Encounter (Signed)
Reference #[id number] the directions for his Amiodarone 200 mg.

## 2013-07-17 MED ORDER — AMIODARONE HCL 200 MG PO TABS: ORAL_TABLET | ORAL | Status: DC

## 2013-07-17 NOTE — Telephone Encounter (Signed)
Pt called back.  Stated taking 1 1/2 (300 mg) until the end of this month and then will start 1 tab daily.  Pt informed RN will send Rx to pharmacy.  Pt verbalized understanding and agreed w/ plan.  Returned call to [employer].  Clarified that pt will take 1 1/2 tabs for the next 17 days and then 1 tab daily thereafter.  Dispense quantity sufficient for 90-day supply.

## 2013-07-17 NOTE — Telephone Encounter (Signed)
Returned call.  Left message to call back before 4pm.  Also can leave a message with current dose of Amioderone so it can be clarified with pharmacy.

## 2013-07-17 NOTE — Telephone Encounter (Signed)
Returning your call. °

## 2013-07-17 NOTE — Telephone Encounter (Signed)
Call to pt to clarify current dose.  Left message to call back before 4pm.

## 2013-07-18 ENCOUNTER — Encounter (HOSPITAL_COMMUNITY)
Admission: RE | Admit: 2013-07-18 | Discharge: 2013-07-18 | Disposition: A | Discharge status: Home / Self Care | Payer: BC Managed Care – PPO | Source: Ambulatory Visit | Attending: Cardiovascular Disease | Admitting: Cardiovascular Disease

## 2013-07-21 ENCOUNTER — Encounter (HOSPITAL_COMMUNITY)
Admission: RE | Admit: 2013-07-21 | Discharge: 2013-07-21 | Disposition: A | Discharge status: Home / Self Care | Payer: BC Managed Care – PPO | Source: Ambulatory Visit | Attending: Cardiovascular Disease | Admitting: Cardiovascular Disease

## 2013-07-23 ENCOUNTER — Encounter (HOSPITAL_COMMUNITY)
Admission: RE | Admit: 2013-07-23 | Discharge: 2013-07-23 | Disposition: A | Discharge status: Home / Self Care | Payer: BC Managed Care – PPO | Source: Ambulatory Visit | Attending: Cardiovascular Disease | Admitting: Cardiovascular Disease

## 2013-07-25 ENCOUNTER — Encounter (HOSPITAL_COMMUNITY)
Admission: RE | Admit: 2013-07-25 | Discharge: 2013-07-25 | Disposition: A | Discharge status: Home / Self Care | Payer: BC Managed Care – PPO | Source: Ambulatory Visit | Attending: Cardiovascular Disease | Admitting: Cardiovascular Disease

## 2013-07-28 ENCOUNTER — Encounter (HOSPITAL_COMMUNITY)
Admission: RE | Admit: 2013-07-28 | Discharge: 2013-07-28 | Disposition: A | Discharge status: Home / Self Care | Payer: BC Managed Care – PPO | Source: Ambulatory Visit | Attending: Cardiovascular Disease | Admitting: Cardiovascular Disease

## 2013-07-30 ENCOUNTER — Encounter (HOSPITAL_COMMUNITY)
Admission: RE | Admit: 2013-07-30 | Discharge: 2013-07-30 | Disposition: A | Discharge status: Home / Self Care | Payer: BC Managed Care – PPO | Source: Ambulatory Visit | Attending: Cardiovascular Disease | Admitting: Cardiovascular Disease

## 2013-08-01 ENCOUNTER — Encounter (HOSPITAL_COMMUNITY)
Admission: RE | Admit: 2013-08-01 | Discharge: 2013-08-01 | Disposition: A | Discharge status: Home / Self Care | Payer: BC Managed Care – PPO | Source: Ambulatory Visit | Attending: Cardiovascular Disease | Admitting: Cardiovascular Disease

## 2013-08-06 ENCOUNTER — Encounter (HOSPITAL_COMMUNITY)
Admission: RE | Admit: 2013-08-06 | Discharge: 2013-08-06 | Disposition: A | Discharge status: Home / Self Care | Payer: BC Managed Care – PPO | Source: Ambulatory Visit | Attending: Cardiovascular Disease | Admitting: Cardiovascular Disease

## 2013-08-06 DIAGNOSIS — Z9861 Coronary angioplasty status: Secondary | ICD-10-CM | POA: Insufficient documentation

## 2013-08-06 DIAGNOSIS — Z5189 Encounter for other specified aftercare: Secondary | ICD-10-CM | POA: Insufficient documentation

## 2013-08-06 DIAGNOSIS — I252 Old myocardial infarction: Secondary | ICD-10-CM | POA: Insufficient documentation

## 2013-08-06 NOTE — Progress Notes (Signed)
Pt reported that his ammiodarone was decreased to 200mg  once a day.   Continue to monitor.

## 2013-08-08 ENCOUNTER — Encounter (HOSPITAL_COMMUNITY)
Admission: RE | Admit: 2013-08-08 | Discharge: 2013-08-08 | Disposition: A | Discharge status: Home / Self Care | Payer: BC Managed Care – PPO | Source: Ambulatory Visit | Attending: Cardiovascular Disease | Admitting: Cardiovascular Disease

## 2013-08-08 IMAGING — CR DG CHEST 1V PORT
1 series · 1 of 1 positions shown · non-contrast
Comparison: None.

CLINICAL DATA: Code ST elevation myocardial infarction, cough

PORTABLE CHEST - 1 VIEW

[AP]
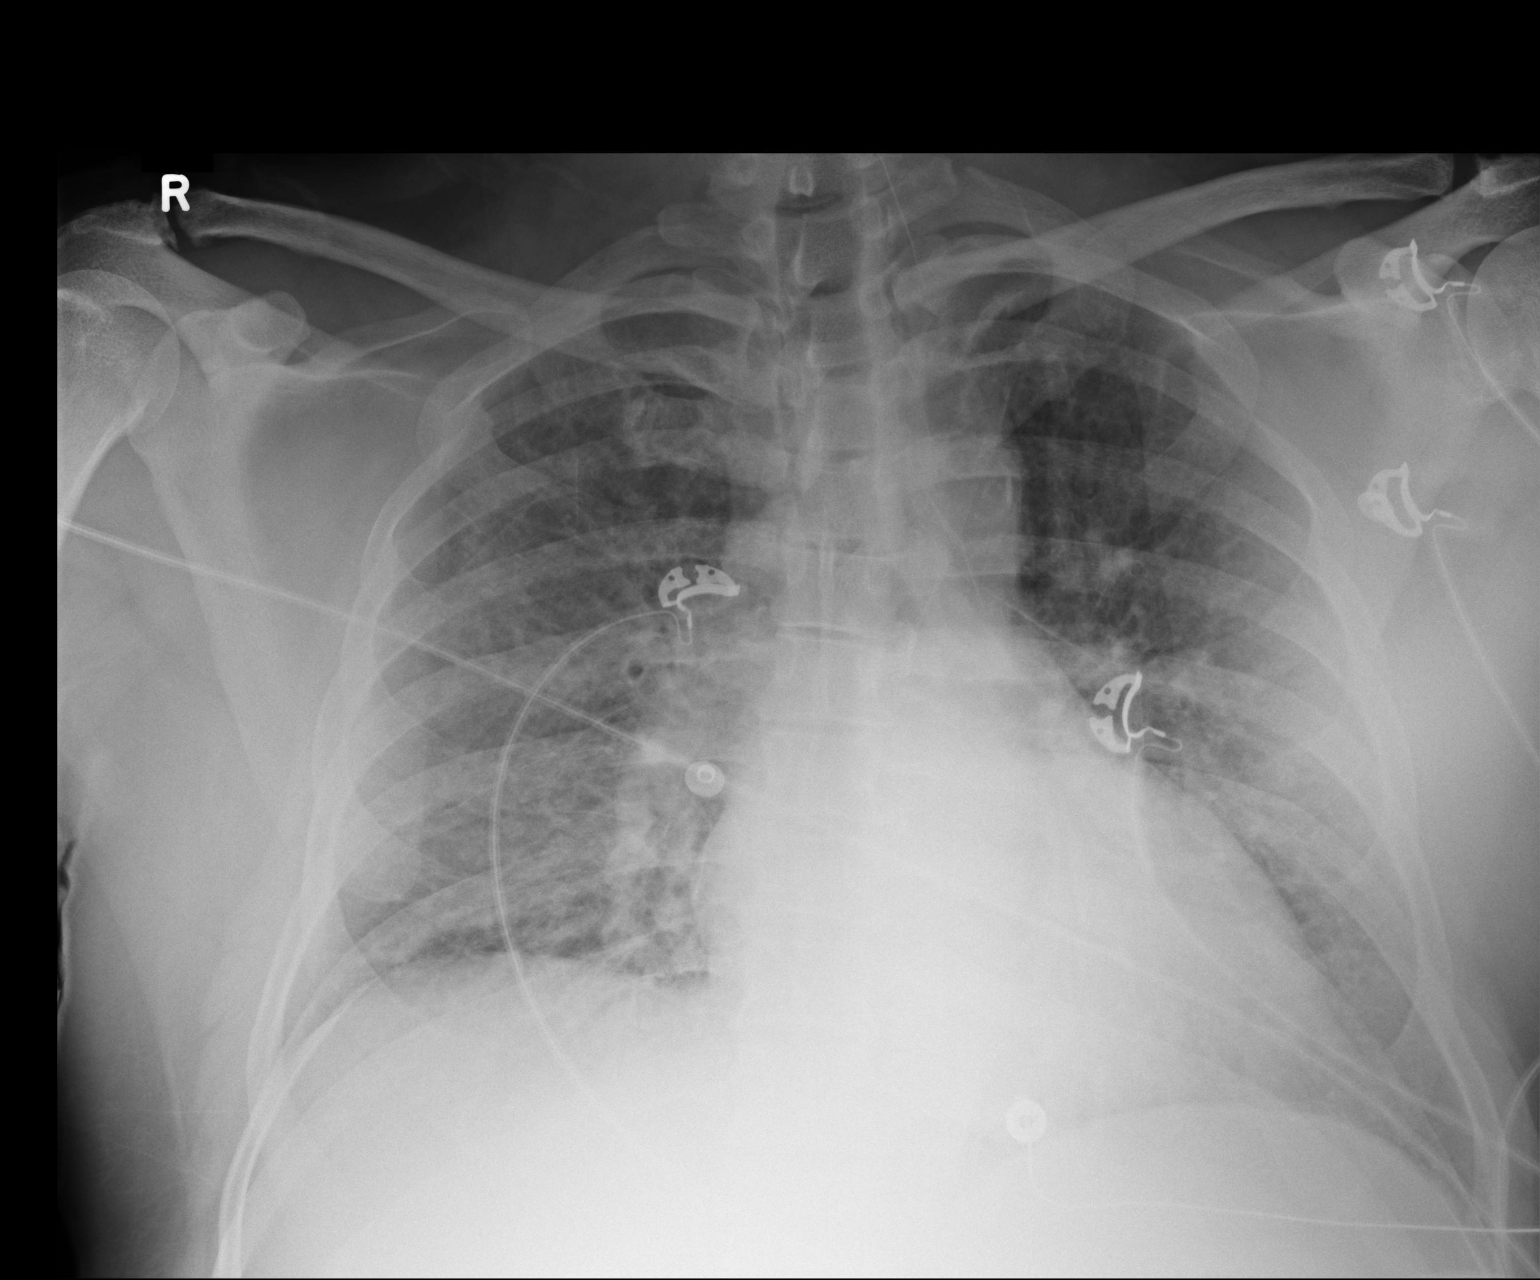

[1 of 1 positions shown; findings below may reference images not displayed]

FINDINGS: Heart size is upper limits of normal.  Central vascular
congestion noted with hazy perihilar airspace opacities but no
focal opacity otherwise.  No pleural effusion. Fine detail is
obscured by patient body habitus.  Mild right AC joint degenerative
change.  No acute osseous abnormality.
IMPRESSION: Mild cardiomegaly with central vascular congestion and hazy
perihilar airspace opacities which could in part be due to
overlying soft tissue structures, although early edema could have a
similar appearance.

## 2013-08-11 ENCOUNTER — Encounter (HOSPITAL_COMMUNITY)
Admission: RE | Admit: 2013-08-11 | Discharge: 2013-08-11 | Disposition: A | Discharge status: Home / Self Care | Payer: BC Managed Care – PPO | Source: Ambulatory Visit | Attending: Cardiovascular Disease | Admitting: Cardiovascular Disease

## 2013-08-13 ENCOUNTER — Encounter (HOSPITAL_COMMUNITY)
Admission: RE | Admit: 2013-08-13 | Discharge: 2013-08-13 | Disposition: A | Discharge status: Home / Self Care | Payer: BC Managed Care – PPO | Source: Ambulatory Visit | Attending: Cardiovascular Disease | Admitting: Cardiovascular Disease

## 2013-08-15 ENCOUNTER — Encounter (HOSPITAL_COMMUNITY)
Admission: RE | Admit: 2013-08-15 | Discharge: 2013-08-15 | Disposition: A | Discharge status: Home / Self Care | Payer: BC Managed Care – PPO | Source: Ambulatory Visit | Attending: Cardiovascular Disease | Admitting: Cardiovascular Disease

## 2013-08-18 ENCOUNTER — Encounter (HOSPITAL_COMMUNITY)
Admission: RE | Admit: 2013-08-18 | Discharge: 2013-08-18 | Disposition: A | Discharge status: Home / Self Care | Payer: BC Managed Care – PPO | Source: Ambulatory Visit | Attending: Cardiovascular Disease | Admitting: Cardiovascular Disease

## 2013-08-20 ENCOUNTER — Encounter (HOSPITAL_COMMUNITY): Payer: BC Managed Care – PPO

## 2013-08-22 ENCOUNTER — Encounter (HOSPITAL_COMMUNITY)
Admission: RE | Admit: 2013-08-22 | Discharge: 2013-08-22 | Disposition: A | Discharge status: Home / Self Care | Payer: BC Managed Care – PPO | Source: Ambulatory Visit | Attending: Cardiovascular Disease | Admitting: Cardiovascular Disease

## 2013-08-25 ENCOUNTER — Encounter (HOSPITAL_COMMUNITY)
Admission: RE | Admit: 2013-08-25 | Discharge: 2013-08-25 | Disposition: A | Discharge status: Home / Self Care | Payer: BC Managed Care – PPO | Source: Ambulatory Visit | Attending: Cardiovascular Disease | Admitting: Cardiovascular Disease

## 2013-08-27 ENCOUNTER — Encounter (HOSPITAL_COMMUNITY)
Admission: RE | Admit: 2013-08-27 | Discharge: 2013-08-27 | Disposition: A | Discharge status: Home / Self Care | Payer: BC Managed Care – PPO | Source: Ambulatory Visit | Attending: Cardiovascular Disease | Admitting: Cardiovascular Disease

## 2013-08-29 ENCOUNTER — Encounter (HOSPITAL_COMMUNITY): Payer: BC Managed Care – PPO

## 2013-09-01 ENCOUNTER — Telehealth: Payer: Self-pay | Admitting: *Deleted

## 2013-09-01 ENCOUNTER — Encounter (HOSPITAL_COMMUNITY)
Admission: RE | Admit: 2013-09-01 | Discharge: 2013-09-01 | Disposition: A | Discharge status: Home / Self Care | Payer: BC Managed Care – PPO | Source: Ambulatory Visit | Attending: Cardiovascular Disease | Admitting: Cardiovascular Disease

## 2013-09-01 NOTE — Telephone Encounter (Signed)
Marvia Pickles stated pt had an episode of dizziness on treadmill today w/ a mild drop in BP.  Stated pt was given gatorade and it resolved.  Stated pt was able to continue exercise w/o further difficulty.  Also stated pt reported numbness in L shoulder over the weekend that resolved on its own.  Stated pt said he thinks he slept on his arm wrong.  Informed Dr. Tresa Endo will be notified.  Verbalized understanding.

## 2013-09-01 NOTE — Progress Notes (Signed)
Pt c/o dizziness while walking on treadmill first station at cardiac rehab.  BP-98/60 which is down from 130/70 at check in. Exercise stopped. Pt given gatorade with resolution of symptoms.  BP-108/64.  Pt able to resume seated exercises without dizziness.  Pt also reports he had episode of left shoulder numbness 2 days ago that self resolved. Pt states he awoke with the symptom and was relieved within several hours.  Pt denies any accompanying symptom.  Spoke to Dr.Kelly's nurse, Hospital doctor.  She will review with Dr. Tresa Endo.

## 2013-09-03 ENCOUNTER — Encounter (HOSPITAL_COMMUNITY)
Admission: RE | Admit: 2013-09-03 | Discharge: 2013-09-03 | Disposition: A | Discharge status: Home / Self Care | Payer: BC Managed Care – PPO | Source: Ambulatory Visit | Attending: Cardiovascular Disease | Admitting: Cardiovascular Disease

## 2013-09-03 DIAGNOSIS — I252 Old myocardial infarction: Secondary | ICD-10-CM | POA: Insufficient documentation

## 2013-09-03 DIAGNOSIS — Z5189 Encounter for other specified aftercare: Secondary | ICD-10-CM | POA: Insufficient documentation

## 2013-09-03 DIAGNOSIS — Z9861 Coronary angioplasty status: Secondary | ICD-10-CM | POA: Insufficient documentation

## 2013-09-05 ENCOUNTER — Encounter (HOSPITAL_COMMUNITY)
Admission: RE | Admit: 2013-09-05 | Discharge: 2013-09-05 | Disposition: A | Discharge status: Home / Self Care | Payer: BC Managed Care – PPO | Source: Ambulatory Visit | Attending: Cardiovascular Disease | Admitting: Cardiovascular Disease

## 2013-09-08 ENCOUNTER — Encounter (HOSPITAL_COMMUNITY)
Admission: RE | Admit: 2013-09-08 | Discharge: 2013-09-08 | Disposition: A | Discharge status: Home / Self Care | Payer: BC Managed Care – PPO | Source: Ambulatory Visit | Attending: Cardiovascular Disease | Admitting: Cardiovascular Disease

## 2013-09-10 ENCOUNTER — Encounter (HOSPITAL_COMMUNITY)
Admission: RE | Admit: 2013-09-10 | Discharge: 2013-09-10 | Disposition: A | Discharge status: Home / Self Care | Payer: BC Managed Care – PPO | Source: Ambulatory Visit | Attending: Cardiovascular Disease | Admitting: Cardiovascular Disease

## 2013-09-10 ENCOUNTER — Other Ambulatory Visit: Payer: Self-pay | Admitting: Internal Medicine

## 2013-09-11 NOTE — Telephone Encounter (Signed)
Rx was sent to pharmacy electronically. 

## 2013-09-12 ENCOUNTER — Encounter (HOSPITAL_COMMUNITY)
Admission: RE | Admit: 2013-09-12 | Discharge: 2013-09-12 | Disposition: A | Discharge status: Home / Self Care | Payer: BC Managed Care – PPO | Source: Ambulatory Visit | Attending: Cardiovascular Disease | Admitting: Cardiovascular Disease

## 2013-09-15 ENCOUNTER — Encounter (HOSPITAL_COMMUNITY): Payer: BC Managed Care – PPO

## 2013-09-17 ENCOUNTER — Ambulatory Visit (INDEPENDENT_AMBULATORY_CARE_PROVIDER_SITE_OTHER): Payer: BC Managed Care – PPO | Admitting: Cardiovascular Disease

## 2013-09-17 ENCOUNTER — Encounter: Payer: Self-pay | Admitting: Cardiovascular Disease

## 2013-09-17 ENCOUNTER — Encounter (HOSPITAL_COMMUNITY): Payer: BC Managed Care – PPO

## 2013-09-17 VITALS — BP 98/70 | HR 60 | Ht 70.0 in | Wt 215.6 lb

## 2013-09-17 DIAGNOSIS — Z9889 Other specified postprocedural states: Secondary | ICD-10-CM

## 2013-09-17 DIAGNOSIS — I739 Peripheral vascular disease, unspecified: Secondary | ICD-10-CM

## 2013-09-17 DIAGNOSIS — Z9861 Coronary angioplasty status: Secondary | ICD-10-CM

## 2013-09-17 DIAGNOSIS — Z955 Presence of coronary angioplasty implant and graft: Secondary | ICD-10-CM

## 2013-09-17 DIAGNOSIS — I219 Acute myocardial infarction, unspecified: Secondary | ICD-10-CM

## 2013-09-17 DIAGNOSIS — Z95820 Peripheral vascular angioplasty status with implants and grafts: Secondary | ICD-10-CM

## 2013-09-17 DIAGNOSIS — I213 ST elevation (STEMI) myocardial infarction of unspecified site: Secondary | ICD-10-CM

## 2013-09-17 DIAGNOSIS — I2510 Atherosclerotic heart disease of native coronary artery without angina pectoris: Secondary | ICD-10-CM

## 2013-09-17 NOTE — Progress Notes (Signed)
Patient ID: Adom Schoeneck, male   DOB: 1965-12-22, 47 y.o.   MRN: [id number]      HPI: Kyuss Hale, is a 47 y.o. male who presents to the office for a three-month cardiology evaluation.   Mr. Wisenbaker suffered an an acute coronary syndrome/ST segment elevation myocardial infarction inferolaterally as well as cardiac arrest secondary to torsade du pointes requiring defibrillation on 03/29/2013.  He was taken emergently to the cardiac catheterization laboratory by me and found  to have a totally occluded proximal circumflex and 85-90% stenosis in his mid right coronary artery. He underwent successful intervention to his circumflex vessel after undergoing thrombectomy which removed significant thrombus. A Xience Xpedition 3.0x15 mm DES stent was inserted which was postdilated 3.3 mm. He was also noted to have a 90% stenosis in his distal circumflex which underwent PTCA and was reduced to less than 30%. There also is mild disease in the OM1 vessel of 50 and 30%.  He left the laboratory with TIMI 3 flow. 2 days later he underwent staged PCI to his RCA.   His circumflex stent was found to be widely patent. He underwent  angiosculpt cutting balloon, and ultimate stenting of the RCA with a 3.25x18 Xience DES stent postdilated to 3.5 mm the 85-90% stenosis reduced to percent. He also underwent PTCA of the acute marginal branch through the stent strut which improved from 95% to less than 40%. He developed a rash which was felt possibly due to Brilinta and had intolerance to lisinopril. Nuclear perfusion study on 05/09/2013 showed reduced LV function with severe hypokinesis to akinesis in the inferior temporal lateral collateral region consistent with his prior infarction. Due to high grade iliac stenosis, he subsequently underwent peripheral intervention by Dr. Allyson Sabal to his right common iliac artery on 06/02/2013. He has felt improved and no longer experiences claudication. He denies any arrhythmias. He denies  palpitations. He denies recurrent chest pain. When I last saw him, I recommended he reduce his amiodarone from 400 mg to 300 mg for one month and then subsequently reduced this to 200 mg. Mr. Schnoor denies any palpitations. He denies any episodes of anginal symptoms. He denies significant shortness of breath. At times he does get mild dizziness.   Past Medical History  Diagnosis Date  . Cardiac arrest, secondary to torsodes with 4 shocks by EMS  03/31/2013  . CAD (coronary artery disease), residual RCA stenosis with recurrent angina 03/31/2013  . S/P angioplasty with stent 03/31/13 to RV ostial marginal with DES Xience Xpedition stent 04/01/2013  . Peripheral vascular disease with claudication     90% ostial right common iliac artery stenosis discovered at the time of cardiac catheterization with an abnormal Doppler study  . Acute MI     2D ECHO, 03/31/2013 - EF 40-45%. normal    Past Surgical History  Procedure Laterality Date  . Fracture surgery      both ankles as a child  . Coronary stent placement  4/27-28/2013    DES to LCx (STEMI culprit); staged PCI to RCA for post STEMI Angina    Allergies  Allergen Reactions  . Lisinopril Rash    rash  . Ticagrelor Rash    Current Outpatient Prescriptions  Medication Sig Dispense Refill  . amiodarone (PACERONE) 200 MG tablet Take 200 mg by mouth daily.      Marland Kitchen aspirin EC 81 MG EC tablet Take 1 tablet (81 mg total) by mouth daily.      Marland Kitchen atorvastatin (LIPITOR) 80 MG tablet  Take 1 tablet (80 mg total) by mouth daily at 6 PM.  90 tablet  3  . carvedilol (COREG) 12.5 MG tablet Take 1 tablet (12.5 mg total) by mouth 2 (two) times daily with a meal.  180 tablet  3  . famotidine (PEPCID) 20 MG tablet Take 1 tablet (20 mg total) by mouth daily.  90 tablet  3  . ibuprofen (ADVIL,MOTRIN) 200 MG tablet Take 400 mg by mouth daily as needed for pain.      . isosorbide mononitrate (IMDUR) 30 MG 24 hr tablet Take 1 tablet (30 mg total) by mouth daily.  90  tablet  3  . loratadine (CLARITIN) 10 MG tablet Take 1 tablet (10 mg total) by mouth daily.  30 tablet  0  . magnesium oxide (MAG-OX) 400 (241.3 MG) MG tablet TAKE 1 TABLET BY MOUTH 2 TIMES DAILY  60 tablet  5  . nitroGLYCERIN (NITROSTAT) 0.4 MG SL tablet Place 0.4 mg under the tongue every 5 (five) minutes as needed for chest pain.      . prasugrel (EFFIENT) 10 MG TABS tablet Take 1 tablet (10 mg total) by mouth daily.  90 tablet  3   No current facility-administered medications for this visit.    Socially he is married. One child. He does exercise with walking. There is remote tobacco history having quit in April 2013. He does drink alcohol. He did smoke occasional marijuana.  ROS is negative for fevers, chills or night sweats.  He denies any further rash. He denies cough. He denies wheezing. He denies sputum production. He denies recurrent chest pain. There are no palpitations. There is no  syncope. He denies bleeding. He denies abdominal pain, nausea vomiting or diarrhea. He denies change in bowel or bladder habits.. Claudication has resolved following his PCI to his right common iliac artery. There is no no edema. There is no diabetes. Other comprehensive 12 point system review is negative.  PE BP 98/70  Pulse 60  Ht 5\' 10"  (1.778 m)  Wt 215 lb 9.6 oz (97.796 kg)  BMI 30.94 kg/m2  General: Alert, oriented, no distress.  Skin: normal turgor, no rashes HEENT: Normocephalic, atraumatic. Pupils round and reactive; sclera anicteric;no lid lag.  Nose without nasal septal hypertrophy Mouth/Parynx benign; Mallinpatti scale  3 Neck: No JVD, no carotid briuts Lungs: clear to ausculatation and percussion; no wheezing or rales Heart: RRR, s1 s2 normal 1/6 sem Abdomen: soft, nontender; no hepatosplenomehaly, BS+; abdominal aorta nontender and not dilated by palpation. Pulses 2+ Extremities: no clubbing cyanosis or edema, Homan's sign negative  Neurologic: grossly nonfocal  ECG: Normal sinus  rhythm at 60 beats per minute. Prominent R wave in V2 suggesting posterior wall involvement. No significant ST changes.  LABS:  BMET    Component Value Date/Time   NA 137 06/03/2013 0455   K 4.4 06/03/2013 0455   CL 104 06/03/2013 0455   CO2 23 06/03/2013 0455   GLUCOSE 113* 06/03/2013 0455   BUN 11 06/03/2013 0455   CREATININE 1.33 06/03/2013 0455   CREATININE 1.23 05/09/2013 0810   CALCIUM 8.9 06/03/2013 0455   GFRNONAA 63* 06/03/2013 0455   GFRAA 73* 06/03/2013 0455     Hepatic Function Panel     Component Value Date/Time   PROT 7.3 04/05/2013 0615   ALBUMIN 3.4* 04/05/2013 0615   AST 34 04/05/2013 0615   ALT 51 04/05/2013 0615   ALKPHOS 67 04/05/2013 0615   BILITOT 0.8 04/05/2013 0615   BILIDIR 0.3  04/05/2013 0615   IBILI 0.5 04/05/2013 0615     CBC    Component Value Date/Time   WBC 11.0* 06/03/2013 0455   RBC 4.73 06/03/2013 0455   HGB 14.4 06/03/2013 0455   HCT 41.1 06/03/2013 0455   PLT 123* 06/03/2013 0455   MCV 86.9 06/03/2013 0455   MCH 30.4 06/03/2013 0455   MCHC 35.0 06/03/2013 0455   RDW 13.7 06/03/2013 0455   LYMPHSABS 0.7 04/05/2013 0057   MONOABS 0.8 04/05/2013 0057   EOSABS 0.1 04/05/2013 0057   BASOSABS 0.1 04/05/2013 0057     BNP No results found for this basename: probnp    Lipid Panel     Component Value Date/Time   CHOL 172 04/01/2013 0503   TRIG 152* 04/01/2013 0503   HDL 45 04/01/2013 0503   CHOLHDL 3.8 04/01/2013 0503   VLDL 30 04/01/2013 0503   LDLCALC 97 04/01/2013 0503     RADIOLOGY: No results found.    ASSESSMENT AND PLAN: Mr. Julian is a 47 year old African American gentleman who is status post a ST segment elevation myocardial infarction on 03/29/2013 secondary to circumflex occlusion acutely with VT/VF requiring successful defibrillation and PTCA/stenting of his circumflex vessel with subsequent staged intervention to his right coronary artery several days later on 03/31/2013. He has not had any arrhythmia since that time. He also was found to have high-grade right common  iliac stenosis and is status post successful percutaneous coronary intervention for his peripheral vascular disease. Prior to that procedure, a nuclear study did not demonstrate any ischemia but did show inferior to inferolateral scar according to the circumflex infarction. Of note, ejection fraction on nuclear study was 32%. His ejection fraction on his echo Doppler study in April was approximately 40-45%. Mr. Wanzer denies any CHF symptoms. Does not appear to be having any arrhythmias. I am recommending now a reduction of his amiodarone to 100 mg for the next month and then he will discontinue this altogether. His blood pressure today is somewhat low at 98 systolically and this reason I will not further titrate his carvedilol. In 3 months I am scheduling him for a followup echo Doppler study to reassess his LV function. I will see him back in the office for followup and further recommendations will be made at that time.  Lennette Bihari, MD, [facility]  09/17/2013 12:03 PM

## 2013-09-17 NOTE — Patient Instructions (Signed)
Your physician has recommended you make the following change in your medication: decrease the amiodarone down to 100 mg for 1 month, then STOP.  Your physician has requested that you have an echocardiogram. Echocardiography is a painless test that uses sound waves to create images of your heart. It provides your doctor with information about the size and shape of your heart and how well your heart's chambers and valves are working. This procedure takes approximately one hour. There are no restrictions for this procedure. This will be scheduled in January 2015.  Your physician recommends that you schedule a follow-up appointment in: January 2015

## 2013-09-18 ENCOUNTER — Encounter: Payer: Self-pay | Admitting: Cardiovascular Disease

## 2013-09-19 ENCOUNTER — Encounter (HOSPITAL_COMMUNITY)
Admission: RE | Admit: 2013-09-19 | Discharge: 2013-09-19 | Disposition: A | Discharge status: Home / Self Care | Payer: BC Managed Care – PPO | Source: Ambulatory Visit | Attending: Cardiovascular Disease | Admitting: Cardiovascular Disease

## 2013-09-22 ENCOUNTER — Encounter (HOSPITAL_COMMUNITY)
Admission: RE | Admit: 2013-09-22 | Discharge: 2013-09-22 | Disposition: A | Discharge status: Home / Self Care | Payer: BC Managed Care – PPO | Source: Ambulatory Visit | Attending: Cardiovascular Disease | Admitting: Cardiovascular Disease

## 2013-09-22 NOTE — Progress Notes (Signed)
Ricky Roberts 47 y.o. male Nutrition Note Spoke with pt.  Nutrition Plan and Nutrition Survey goals reviewed with pt. Pt is following Step 1 of the Therapeutic Lifestyle Changes diet. Pt wants to lose wt. Pt has not been actively trying to lose wt. Pt wt today reportedly 214.9 lb (97.7 kg), which is up 0.6 kg from admission. Pt wt 217.9 lb (99.0 kg) 5 months ago according to EMR. Pt wt is down 3 lb over the past 5 months. Wt loss tips reviewed. Pt expressed understanding of the information reviewed. Pt aware of nutrition education classes offered and has attended 1 nutrition class.  Nutrition Diagnosis   Food-and nutrition-related knowledge deficit related to lack of exposure to information as related to diagnosis of: ? CVD    Obesity related to excessive energy intake as evidenced by a BMI of 30.1    Nutrition RX/ Estimated Daily Nutrition Needs for: wt loss  1800-2300 Kcal, 50-60 gm fat, 12-15 gm sat fat, 1.8-2.3 gm trans-fat, <1500 mg sodium   Nutrition Intervention   Pt's individual nutrition plan including cholesterol goals reviewed with pt.   Benefits of adopting Therapeutic Lifestyle Changes discussed when Medficts reviewed.   Pt to attend the Portion Distortion class   Pt to attend the ? Nutrition II class - met 09/09/13   Pt given handouts for: ? Nutrition I class   Continue client-centered nutrition education by RD, as part of interdisciplinary care.  Goal(s)   Pt to identify and limit food sources of saturated fat, trans fat, and cholesterol   Pt to identify food quantities necessary to achieve: ? wt loss to a goal wt of 190-208 lb (86.2-94.4 kg) at graduation from cardiac rehab.   Monitor and Evaluate progress toward nutrition goal with team. Nutrition Risk:  Low   Mickle Plumb, M.Ed, RD, LDN, CDE 09/22/2013 9:42 AM

## 2013-09-24 ENCOUNTER — Encounter (HOSPITAL_COMMUNITY)
Admission: RE | Admit: 2013-09-24 | Discharge: 2013-09-24 | Disposition: A | Discharge status: Home / Self Care | Payer: BC Managed Care – PPO | Source: Ambulatory Visit | Attending: Cardiovascular Disease | Admitting: Cardiovascular Disease

## 2013-09-26 ENCOUNTER — Encounter (HOSPITAL_COMMUNITY)
Admission: RE | Admit: 2013-09-26 | Discharge: 2013-09-26 | Disposition: A | Discharge status: Home / Self Care | Payer: BC Managed Care – PPO | Source: Ambulatory Visit | Attending: Cardiovascular Disease | Admitting: Cardiovascular Disease

## 2013-09-29 ENCOUNTER — Encounter (HOSPITAL_COMMUNITY)
Admission: RE | Admit: 2013-09-29 | Discharge: 2013-09-29 | Disposition: A | Discharge status: Home / Self Care | Payer: BC Managed Care – PPO | Source: Ambulatory Visit | Attending: Cardiovascular Disease | Admitting: Cardiovascular Disease

## 2013-10-01 ENCOUNTER — Encounter (HOSPITAL_COMMUNITY)
Admission: RE | Admit: 2013-10-01 | Discharge: 2013-10-01 | Disposition: A | Discharge status: Home / Self Care | Payer: BC Managed Care – PPO | Source: Ambulatory Visit | Attending: Cardiovascular Disease | Admitting: Cardiovascular Disease

## 2013-10-03 ENCOUNTER — Encounter (HOSPITAL_COMMUNITY)
Admission: RE | Admit: 2013-10-03 | Discharge: 2013-10-03 | Disposition: A | Discharge status: Home / Self Care | Payer: BC Managed Care – PPO | Source: Ambulatory Visit | Attending: Cardiovascular Disease | Admitting: Cardiovascular Disease

## 2013-10-06 ENCOUNTER — Encounter (HOSPITAL_COMMUNITY)
Admission: RE | Admit: 2013-10-06 | Discharge: 2013-10-06 | Disposition: A | Discharge status: Home / Self Care | Payer: BC Managed Care – PPO | Source: Ambulatory Visit | Attending: Cardiovascular Disease | Admitting: Cardiovascular Disease

## 2013-10-06 DIAGNOSIS — Z9861 Coronary angioplasty status: Secondary | ICD-10-CM | POA: Insufficient documentation

## 2013-10-06 DIAGNOSIS — Z5189 Encounter for other specified aftercare: Secondary | ICD-10-CM | POA: Insufficient documentation

## 2013-10-06 DIAGNOSIS — I252 Old myocardial infarction: Secondary | ICD-10-CM | POA: Insufficient documentation

## 2013-10-08 ENCOUNTER — Encounter (HOSPITAL_COMMUNITY)
Admission: RE | Admit: 2013-10-08 | Discharge: 2013-10-08 | Disposition: A | Discharge status: Home / Self Care | Payer: BC Managed Care – PPO | Source: Ambulatory Visit | Attending: Cardiovascular Disease | Admitting: Cardiovascular Disease

## 2013-10-08 ENCOUNTER — Other Ambulatory Visit: Payer: Self-pay | Admitting: Internal Medicine

## 2013-10-08 NOTE — Telephone Encounter (Signed)
Rx was sent to pharmacy electronically. 

## 2013-10-09 ENCOUNTER — Other Ambulatory Visit: Payer: Self-pay

## 2013-10-10 ENCOUNTER — Encounter (HOSPITAL_COMMUNITY): Payer: BC Managed Care – PPO

## 2013-10-10 ENCOUNTER — Telehealth: Payer: Self-pay | Admitting: *Deleted

## 2013-10-10 NOTE — Telephone Encounter (Signed)
Faxed signed cardiac rehab order back.

## 2013-12-02 ENCOUNTER — Telehealth (HOSPITAL_COMMUNITY): Payer: Self-pay | Admitting: *Deleted

## 2013-12-05 ENCOUNTER — Telehealth (HOSPITAL_COMMUNITY): Payer: Self-pay | Admitting: *Deleted

## 2013-12-09 ENCOUNTER — Ambulatory Visit (HOSPITAL_COMMUNITY)
Admission: RE | Admit: 2013-12-09 | Discharge: 2013-12-09 | Disposition: A | Discharge status: Home / Self Care | Payer: BC Managed Care – PPO | Source: Ambulatory Visit | Attending: Cardiovascular Disease | Admitting: Cardiovascular Disease

## 2013-12-09 DIAGNOSIS — I739 Peripheral vascular disease, unspecified: Secondary | ICD-10-CM | POA: Insufficient documentation

## 2013-12-09 DIAGNOSIS — I059 Rheumatic mitral valve disease, unspecified: Secondary | ICD-10-CM | POA: Insufficient documentation

## 2013-12-09 DIAGNOSIS — I252 Old myocardial infarction: Secondary | ICD-10-CM | POA: Insufficient documentation

## 2013-12-09 DIAGNOSIS — I2510 Atherosclerotic heart disease of native coronary artery without angina pectoris: Secondary | ICD-10-CM | POA: Insufficient documentation

## 2013-12-09 NOTE — Progress Notes (Signed)
2D Echo Performed 12/09/2013    Vannie Hochstetler, RCS  

## 2013-12-23 ENCOUNTER — Telehealth (HOSPITAL_COMMUNITY): Payer: Self-pay | Admitting: *Deleted

## 2013-12-29 ENCOUNTER — Other Ambulatory Visit (HOSPITAL_COMMUNITY): Payer: Self-pay | Admitting: Cardiovascular Disease

## 2013-12-29 DIAGNOSIS — I739 Peripheral vascular disease, unspecified: Secondary | ICD-10-CM

## 2013-12-30 ENCOUNTER — Telehealth: Payer: Self-pay | Admitting: *Deleted

## 2013-12-30 NOTE — Telephone Encounter (Signed)
Message forwarded to W. Waddell, CMA.  

## 2013-12-30 NOTE — Telephone Encounter (Signed)
Pt called stating that he needed an appointment with Dr. Claiborne Billings this month to read his Echo results. He stated that he missed his appointment with Dr. Claiborne Billings on the 23rd, I did not see an appointment. I offered him Dr. Evette Georges next available which is in Feb and he did not want it. He wanted one sooner or have someone call him with the results of his Echo.   TK

## 2013-12-30 NOTE — Telephone Encounter (Signed)
Called patient and verbally gave echo results. Explained to him the need for a follow up appointment. He agreed with plan. I will have the scheduler to call patient with a follow up appointment. Patient also asked if okay for him to take a"steroid medication" that was given to him by his PCP yesterday. He does not know the name of it. He will call ball and leave the name of it for me to ask Dr. Claiborne Billings about.

## 2014-01-01 ENCOUNTER — Ambulatory Visit (HOSPITAL_COMMUNITY)
Admission: RE | Admit: 2014-01-01 | Discharge: 2014-01-01 | Disposition: A | Discharge status: Home / Self Care | Payer: BC Managed Care – PPO | Source: Ambulatory Visit | Attending: Cardiovascular Disease | Admitting: Cardiovascular Disease

## 2014-01-01 DIAGNOSIS — I70219 Atherosclerosis of native arteries of extremities with intermittent claudication, unspecified extremity: Secondary | ICD-10-CM

## 2014-01-01 DIAGNOSIS — I739 Peripheral vascular disease, unspecified: Secondary | ICD-10-CM

## 2014-01-01 NOTE — Progress Notes (Signed)
Arterial Lower Ext. Duplex Completed. Channell Quattrone, BS, RDMS, RVT  

## 2014-01-12 ENCOUNTER — Telehealth: Payer: Self-pay | Admitting: *Deleted

## 2014-01-12 DIAGNOSIS — I739 Peripheral vascular disease, unspecified: Secondary | ICD-10-CM

## 2014-01-12 NOTE — Telephone Encounter (Signed)
Order placed for repeat lower ext dopplers in 1 year

## 2014-01-12 NOTE — Telephone Encounter (Signed)
Message copied by Chauncy Lean on Mon Jan 12, 2014 10:05 PM ------      Message from: Lorretta Harp      Created: Thu Jan 08, 2014  7:50 PM       Improvement in bilateral ABIs. Repeat in 12 months ------

## 2014-03-18 ENCOUNTER — Other Ambulatory Visit: Payer: Self-pay | Admitting: *Deleted

## 2014-03-18 MED ORDER — NITROGLYCERIN 0.4 MG SL SUBL: 0.4000 mg | SUBLINGUAL_TABLET | SUBLINGUAL | Status: AC | PRN

## 2014-03-18 NOTE — Telephone Encounter (Signed)
Rx refill sent to patients pharmacy  

## 2014-03-26 NOTE — Telephone Encounter (Signed)
Closed encounter °

## 2014-05-06 ENCOUNTER — Ambulatory Visit (INDEPENDENT_AMBULATORY_CARE_PROVIDER_SITE_OTHER): Payer: BC Managed Care – PPO | Admitting: Cardiovascular Disease

## 2014-05-06 ENCOUNTER — Encounter: Payer: Self-pay | Admitting: Cardiovascular Disease

## 2014-05-06 VITALS — BP 120/76 | HR 52 | Ht 71.0 in | Wt 218.9 lb

## 2014-05-06 DIAGNOSIS — E669 Obesity, unspecified: Secondary | ICD-10-CM

## 2014-05-06 DIAGNOSIS — Z79899 Other long term (current) drug therapy: Secondary | ICD-10-CM

## 2014-05-06 DIAGNOSIS — E785 Hyperlipidemia, unspecified: Secondary | ICD-10-CM | POA: Insufficient documentation

## 2014-05-06 DIAGNOSIS — I2510 Atherosclerotic heart disease of native coronary artery without angina pectoris: Secondary | ICD-10-CM

## 2014-05-06 DIAGNOSIS — R5383 Other fatigue: Secondary | ICD-10-CM

## 2014-05-06 DIAGNOSIS — I739 Peripheral vascular disease, unspecified: Secondary | ICD-10-CM

## 2014-05-06 DIAGNOSIS — R5381 Other malaise: Secondary | ICD-10-CM

## 2014-05-06 NOTE — Patient Instructions (Signed)
Your physician recommends that you return for lab work fasting.  Your physician recommends that you schedule a follow-up appointment in: 6 months. 

## 2014-05-06 NOTE — Progress Notes (Signed)
Patient ID: Ricky Roberts, male   DOB: 05-30-1966, 48 y.o.   MRN: [id number]       HPI: [name] is a 48 y.o. male who presents to the office for a 41-month cardiology evaluation.   Mr. Posadas suffered an an acute coronary syndrome/ST segment elevation myocardial infarction inferolaterally as well as cardiac arrest secondary to torsade du pointes requiring defibrillation on 03/29/2013.  He was taken emergently to the cardiac catheterization laboratory by me and found  to have a totally occluded proximal circumflex and 85-90% stenosis in his mid right coronary artery. He underwent successful intervention to his circumflex vessel after undergoing thrombectomy which removed significant thrombus. A Xience Xpedition 3.0x15 mm DES stent was inserted which was postdilated 3.3 mm. He was also noted to have a 90% stenosis in his distal circumflex which underwent PTCA and was reduced to less than 30%. There also is mild disease in the OM1 vessel of 50 and 30%.  He left the laboratory with TIMI 3 flow. 2 days later he underwent staged PCI to his RCA.   His circumflex stent was found to be widely patent. He underwent  angiosculpt cutting balloon, and ultimate stenting of the RCA with a 3.25x18 Xience DES stent postdilated to 3.5 mm the 85-90% stenosis reduced to percent. He also underwent PTCA of the acute marginal branch through the stent strut which improved from 95% to less than 40%. He developed a rash which was felt possibly due to Brilinta and had intolerance to lisinopril. Nuclear perfusion study on 05/09/2013 showed reduced LV function with severe hypokinesis to akinesis in the inferior temporal lateral collateral region consistent with his prior infarction. Due to high grade iliac stenosis, he subsequently underwent peripheral intervention by Dr. Gwenlyn Found to his right common iliac artery on 06/02/2013. He has felt improved and no longer experiences claudication. He denies any arrhythmias. He denies  palpitations.  Over the past 8 months, his dose of amiodarone has been weaned and ultimately discontinued.  There is no further arrhythmias.  He underwent a followup of duplex scan to his lower extremity in January 2015, which suggested an open and patent right common iliac stent without evidence for restenosis.  ABIs were normal bilaterally.  A 1.2.  In January 2015, a followup echo Doppler study showed an ejection fraction of 40-45%.  There was mild mitral regurgitation.  The grade 1 diastolic dysfunction without regional wall motion abnormalities.  Since I last saw him, he denies recurrent anginal symptoms.  He denies recurrent claudication.  He denies change in exercise tolerance.  There is no PND or orthopnea.  He's not had recent laboratory.  He presents for evaluation  Past Medical History  Diagnosis Date  . Cardiac arrest, secondary to torsodes with 4 shocks by EMS  03/31/2013  . CAD (coronary artery disease), residual RCA stenosis with recurrent angina 03/31/2013  . S/P angioplasty with stent 03/31/13 to RV ostial marginal with DES Xience Xpedition stent 04/01/2013  . Peripheral vascular disease with claudication     90% ostial right common iliac artery stenosis discovered at the time of cardiac catheterization with an abnormal Doppler study  . Acute MI     2D ECHO, 03/31/2013 - EF 40-45%. normal    Past Surgical History  Procedure Laterality Date  . Fracture surgery      both ankles as a child  . Coronary stent placement  4/27-28/2013    DES to LCx (STEMI culprit); staged PCI to RCA for post STEMI Angina  Allergies  Allergen Reactions  . Lisinopril Rash    rash  . Ticagrelor Rash    Current Outpatient Prescriptions  Medication Sig Dispense Refill  . aspirin EC 81 MG EC tablet Take 1 tablet (81 mg total) by mouth daily.      Marland Kitchen atorvastatin (LIPITOR) 80 MG tablet Take 1 tablet (80 mg total) by mouth daily at 6 PM.  90 tablet  3  . carvedilol (COREG) 12.5 MG tablet Take 1  tablet (12.5 mg total) by mouth 2 (two) times daily with a meal.  180 tablet  3  . famotidine (PEPCID) 20 MG tablet Take 1 tablet (20 mg total) by mouth daily.  90 tablet  3  . ibuprofen (ADVIL,MOTRIN) 200 MG tablet Take 400 mg by mouth daily as needed for pain.      . isosorbide mononitrate (IMDUR) 30 MG 24 hr tablet Take 1 tablet (30 mg total) by mouth daily.  90 tablet  3  . loratadine (CLARITIN) 10 MG tablet Take 1 tablet (10 mg total) by mouth daily.  30 tablet  0  . magnesium oxide (MAG-OX) 400 (241.3 MG) MG tablet TAKE 1 TABLET BY MOUTH 2 TIMES DAILY  60 tablet  6  . nitroGLYCERIN (NITROSTAT) 0.4 MG SL tablet Place 1 tablet (0.4 mg total) under the tongue every 5 (five) minutes as needed for chest pain.  25 tablet  1  . prasugrel (EFFIENT) 10 MG TABS tablet Take 1 tablet (10 mg total) by mouth daily.  90 tablet  3   No current facility-administered medications for this visit.    Socially he is married. One child. He does exercise with walking. There is remote tobacco history having quit in April 2013. He does drink alcohol. He did smoke occasional marijuana.  ROS General: Negative; No fevers, chills, or night sweats; no significant weight loss HEENT: Negative; No changes in vision or hearing, sinus congestion, difficulty swallowing Pulmonary: Negative; No cough, wheezing, shortness of breath, hemoptysis Cardiovascular: Negative; No chest pain, presyncope, syncope, palpatations No claudication symptoms GI: Negative; No nausea, vomiting, diarrhea, or abdominal pain GU: Negative; No dysuria, hematuria, or difficulty voiding Musculoskeletal: Negative; no myalgias, joint pain, or weakness Hematologic/Oncology: Negative; no easy bruising, bleeding Endocrine: Negative; no heat/cold intolerance; no diabetes Neuro: Negative; no changes in balance, headaches Skin: Negative; No rashes or skin lesions Psychiatric: Negative; No behavioral problems, depression Sleep: Negative; No snoring,  daytime sleepiness, hypersomnolence, bruxism, restless legs, hypnogognic hallucinations, no cataplexy Other comprehensive 14 point system review is negative.   PE BP 120/76  Pulse 52  Ht 5\' 11"  (1.803 m)  Wt 218 lb 14.4 oz (99.292 kg)  BMI 30.54 kg/m2  General: Alert, oriented, no distress.  Skin: normal turgor, no rashes HEENT: Normocephalic, atraumatic. Pupils round and reactive; sclera anicteric;no lid lag.  Nose without nasal septal hypertrophy Mouth/Parynx benign; Mallinpatti scale  3 Neck: No JVD, no carotid bruits with normal carotid upstroke Lungs: clear to ausculatation and percussion; no wheezing or rales Chest wall: Nontender to palpation Heart: RRR, s1 s2 normal 1/6 sem; no diastolic murmur, no rubs, thrills or heaves. Abdomen: soft, nontender; no hepatosplenomehaly, BS+; abdominal aorta nontender and not dilated by palpation. Back: No CVA tenderness Pulses 2+ Extremities: no clubbing cyanosis or edema, Homan's sign negative  Neurologic: grossly nonfocal Psychological: Normal affect and mood  ECG privacies independently read by me): Sinus bradycardia 52 beats per minute.  Early transition, compatible with his posterior wall MI.  One isolated PVC.  QTc interval  411 milliseconds.  PR interval 152 msec  Prior October 2014 ECG: Normal sinus rhythm at 60 beats per minute. Prominent R wave in V2 suggesting posterior wall involvement. No significant ST changes.  LABS:  BMET    Component Value Date/Time   NA 137 06/03/2013 0455   K 4.4 06/03/2013 0455   CL 104 06/03/2013 0455   CO2 23 06/03/2013 0455   GLUCOSE 113* 06/03/2013 0455   BUN 11 06/03/2013 0455   CREATININE 1.33 06/03/2013 0455   CREATININE 1.23 05/09/2013 0810   CALCIUM 8.9 06/03/2013 0455   GFRNONAA 63* 06/03/2013 0455   GFRAA 73* 06/03/2013 0455     Hepatic Function Panel     Component Value Date/Time   PROT 7.3 04/05/2013 0615   ALBUMIN 3.4* 04/05/2013 0615   AST 34 04/05/2013 0615   ALT 51 04/05/2013 0615   ALKPHOS 67  04/05/2013 0615   BILITOT 0.8 04/05/2013 0615   BILIDIR 0.3 04/05/2013 0615   IBILI 0.5 04/05/2013 0615     CBC    Component Value Date/Time   WBC 11.0* 06/03/2013 0455   RBC 4.73 06/03/2013 0455   HGB 14.4 06/03/2013 0455   HCT 41.1 06/03/2013 0455   PLT 123* 06/03/2013 0455   MCV 86.9 06/03/2013 0455   MCH 30.4 06/03/2013 0455   MCHC 35.0 06/03/2013 0455   RDW 13.7 06/03/2013 0455   LYMPHSABS 0.7 04/05/2013 0057   MONOABS 0.8 04/05/2013 0057   EOSABS 0.1 04/05/2013 0057   BASOSABS 0.1 04/05/2013 0057     BNP No results found for this basename: probnp    Lipid Panel     Component Value Date/Time   CHOL 172 04/01/2013 0503   TRIG 152* 04/01/2013 0503   HDL 45 04/01/2013 0503   CHOLHDL 3.8 04/01/2013 0503   VLDL 30 04/01/2013 0503   LDLCALC 97 04/01/2013 0503     RADIOLOGY: No results found.    ASSESSMENT AND PLAN: Mr. Benko is a 48 year old African American gentleman who isuffered an ST segment elevation myocardial infarction on 03/29/2013 secondary to circumflex occlusion acutely with VT/VF requiring successful defibrillation and PTCA/stenting of his circumflex vessel with subsequent staged intervention to his right coronary artery several days later on 03/31/2013. He has not had any arrhythmia since that time. He also was found to have high-grade right common iliac stenosis and is status post successful percutaneous coronary intervention for his peripheral vascular disease. Prior to that procedure, a nuclear study did not demonstrate any ischemia but did show inferior to inferolateral scar according to the circumflex infarction. Of note, ejection fraction on nuclear study was 32%.  Since I last saw Mr. Holmen, his followup echo Doppler study showed an ejection fraction of 40-45% with grade 1 diastolic dysfunction and without definitive wall motion abnormality.  A followup duplex scan of his lower extremity continues to suggest patency of his right common iliac stent.  He is asymptomatic with reference to  chest pain.  He is unaware of any arrhythmia.  He is off his amiodarone therapy.  He is tolerating low-dose aspirin with passive girl.  Remotely he did develop a possible rash secondary to the Alimta.  He has had issues with reduction of erectile function.  He is on isosorbide mononitrate and is not a candidate for Viagra, Cialis or Levitra.  I am recommending blood work be obtained in the fasting state and I will contact him if adjustments need to be made to his medical regimen.  I did discuss the  importance of weight loss and to achieve a body mass index at least less than 30. I will see him in 6 months for cardiology evaluation.  Troy Sine, MD, [facility]  05/06/2014 10:57 AM

## 2014-05-14 LAB — CBC
HEMATOCRIT: 43.5 % (ref 39.0–52.0)
HEMOGLOBIN: 14.9 g/dL (ref 13.0–17.0)
MCH: 30.6 pg (ref 26.0–34.0)
MCHC: 34.3 g/dL (ref 30.0–36.0)
MCV: 89.3 fL (ref 78.0–100.0)
Platelets: 144 10*3/uL — ABNORMAL LOW (ref 150–400)
RBC: 4.87 MIL/uL (ref 4.22–5.81)
RDW: 14.4 % (ref 11.5–15.5)
WBC: 6.9 10*3/uL (ref 4.0–10.5)

## 2014-05-14 LAB — COMPREHENSIVE METABOLIC PANEL
ALBUMIN: 4.5 g/dL (ref 3.5–5.2)
ALT: 22 U/L (ref 0–53)
AST: 21 U/L (ref 0–37)
Alkaline Phosphatase: 50 U/L (ref 39–117)
BUN: 10 mg/dL (ref 6–23)
CALCIUM: 9.7 mg/dL (ref 8.4–10.5)
CHLORIDE: 106 meq/L (ref 96–112)
CO2: 27 meq/L (ref 19–32)
Creat: 1.04 mg/dL (ref 0.50–1.35)
GLUCOSE: 85 mg/dL (ref 70–99)
POTASSIUM: 4.3 meq/L (ref 3.5–5.3)
SODIUM: 140 meq/L (ref 135–145)
TOTAL PROTEIN: 6.8 g/dL (ref 6.0–8.3)
Total Bilirubin: 0.9 mg/dL (ref 0.2–1.2)

## 2014-05-14 LAB — LIPID PANEL
CHOLESTEROL: 121 mg/dL (ref 0–200)
HDL: 45 mg/dL (ref >39)
LDL CALC: 52 mg/dL (ref 0–99)
TRIGLYCERIDES: 118 mg/dL (ref <150)
Total CHOL/HDL Ratio: 2.7 Ratio
VLDL: 24 mg/dL (ref 0–40)

## 2014-05-14 LAB — TSH: TSH: 1.39 u[IU]/mL (ref 0.350–4.500)

## 2014-06-10 ENCOUNTER — Other Ambulatory Visit: Payer: Self-pay | Admitting: Cardiovascular Disease

## 2014-06-11 ENCOUNTER — Other Ambulatory Visit: Payer: Self-pay | Admitting: Cardiovascular Disease

## 2014-06-11 NOTE — Telephone Encounter (Signed)
Rx refill sent to patient pharmacy   

## 2014-10-19 ENCOUNTER — Other Ambulatory Visit: Payer: Self-pay | Admitting: Cardiovascular Disease

## 2014-10-19 NOTE — Telephone Encounter (Signed)
Rx refill sent to patient pharmacy   

## 2014-11-12 ENCOUNTER — Encounter (HOSPITAL_COMMUNITY): Payer: Self-pay | Admitting: Cardiovascular Disease

## 2014-12-08 ENCOUNTER — Telehealth (HOSPITAL_COMMUNITY): Payer: Self-pay | Admitting: *Deleted

## 2014-12-18 ENCOUNTER — Telehealth: Payer: Self-pay | Admitting: Cardiovascular Disease

## 2014-12-18 NOTE — Telephone Encounter (Signed)
Close encounter 

## 2015-01-12 ENCOUNTER — Ambulatory Visit (HOSPITAL_COMMUNITY)
Admission: RE | Admit: 2015-01-12 | Discharge: 2015-01-12 | Disposition: A | Discharge status: Home / Self Care | Payer: BLUE CROSS/BLUE SHIELD | Source: Ambulatory Visit | Attending: Cardiology | Admitting: Cardiology

## 2015-01-12 DIAGNOSIS — I739 Peripheral vascular disease, unspecified: Secondary | ICD-10-CM | POA: Diagnosis present

## 2015-01-12 NOTE — Progress Notes (Signed)
Lower Extremity Arterial Duplex Completed. °Brianna L Mazza,RVT °

## 2015-01-26 ENCOUNTER — Encounter: Payer: Self-pay | Admitting: Cardiology

## 2015-01-26 ENCOUNTER — Encounter: Payer: Self-pay | Admitting: Cardiovascular Disease

## 2015-01-26 ENCOUNTER — Ambulatory Visit (INDEPENDENT_AMBULATORY_CARE_PROVIDER_SITE_OTHER): Payer: BLUE CROSS/BLUE SHIELD | Admitting: Cardiovascular Disease

## 2015-01-26 VITALS — BP 120/82 | HR 62 | Ht 70.0 in | Wt 224.3 lb

## 2015-01-26 DIAGNOSIS — Z95820 Peripheral vascular angioplasty status with implants and grafts: Secondary | ICD-10-CM

## 2015-01-26 DIAGNOSIS — E785 Hyperlipidemia, unspecified: Secondary | ICD-10-CM

## 2015-01-26 DIAGNOSIS — I2510 Atherosclerotic heart disease of native coronary artery without angina pectoris: Secondary | ICD-10-CM

## 2015-01-26 DIAGNOSIS — I739 Peripheral vascular disease, unspecified: Secondary | ICD-10-CM

## 2015-01-26 DIAGNOSIS — E669 Obesity, unspecified: Secondary | ICD-10-CM

## 2015-01-26 DIAGNOSIS — Z79899 Other long term (current) drug therapy: Secondary | ICD-10-CM

## 2015-01-26 DIAGNOSIS — Z9889 Other specified postprocedural states: Secondary | ICD-10-CM

## 2015-01-26 MED ORDER — CLOPIDOGREL BISULFATE 75 MG PO TABS
75.0000 mg | ORAL_TABLET | Freq: Every day | ORAL | Status: DC
Start: 2015-01-26 — End: 2016-02-16

## 2015-01-26 NOTE — Patient Instructions (Signed)
Your physician recommends that you schedule a follow-up appointment in: 6 months with Dr. Claiborne Billings  Stop taking your effient  When you run out and start taking Plavix, one week after you are taking plavix have the blood test called p2y12, this will have to be done at the hospital

## 2015-01-27 LAB — CBC
HEMATOCRIT: 42.9 % (ref 39.0–52.0)
Hemoglobin: 15.4 g/dL (ref 13.0–17.0)
MCH: 30.7 pg (ref 26.0–34.0)
MCHC: 35.9 g/dL (ref 30.0–36.0)
MCV: 85.6 fL (ref 78.0–100.0)
MPV: 10.6 fL (ref 8.6–12.4)
PLATELETS: 162 10*3/uL (ref 150–400)
RBC: 5.01 MIL/uL (ref 4.22–5.81)
RDW: 13.7 % (ref 11.5–15.5)
WBC: 6.2 10*3/uL (ref 4.0–10.5)

## 2015-01-27 LAB — LIPID PANEL
CHOL/HDL RATIO: 3 ratio
Cholesterol: 126 mg/dL (ref 0–200)
HDL: 42 mg/dL (ref >=40)
LDL Cholesterol: 60 mg/dL (ref 0–99)
Triglycerides: 119 mg/dL (ref <150)
VLDL: 24 mg/dL (ref 0–40)

## 2015-01-27 LAB — COMPLETE METABOLIC PANEL WITH GFR
ALT: 27 U/L (ref 0–53)
AST: 21 U/L (ref 0–37)
Albumin: 4.3 g/dL (ref 3.5–5.2)
Alkaline Phosphatase: 56 U/L (ref 39–117)
BILIRUBIN TOTAL: 0.8 mg/dL (ref 0.2–1.2)
BUN: 10 mg/dL (ref 6–23)
CALCIUM: 9.3 mg/dL (ref 8.4–10.5)
CHLORIDE: 104 meq/L (ref 96–112)
CO2: 23 meq/L (ref 19–32)
CREATININE: 0.99 mg/dL (ref 0.50–1.35)
Glucose, Bld: 91 mg/dL (ref 70–99)
Potassium: 4.3 mEq/L (ref 3.5–5.3)
Sodium: 138 mEq/L (ref 135–145)
Total Protein: 6.9 g/dL (ref 6.0–8.3)

## 2015-01-28 ENCOUNTER — Encounter: Payer: Self-pay | Admitting: Cardiovascular Disease

## 2015-01-28 NOTE — Progress Notes (Signed)
Patient ID: Ricky Roberts, male   DOB: November 11, 1966, 49 y.o.   MRN: [id number]     HPI: Ricky Roberts is a 49 y.o. male who presents to the office for an 69-monthcardiology evaluation.   Mr. 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  He was taken emergently to the cardiac catheterization laboratory by me and found  to have a totally occluded proximal circumflex and 85-90% stenosis in his mid RCA. He underwent successful intervention to his circumflex vessel after undergoing thrombectomy which removed significant thrombus. A Xience Xpedition 3.0x15 mm DES stent was inserted which was postdilated 3.3 mm. He was also noted to have a 90% stenosis in his distal circumflex which underwent PTCA and was reduced to less than 30%. There also is mild disease in the OM1 vessel of 50 and 30%.  He left the laboratory with TIMI 3 flow. 2 days later he underwent staged PCI to his RCA.   His circumflex stent was found to be widely patent. He underwent  angiosculpt cutting balloon, and ultimate stenting of the RCA with a 3.25x18 Xience DES stent postdilated to 3.5 mm the 85-90% stenosis reduced to percent. He also underwent PTCA of the acute marginal branch through the stent strut which improved from 95% to less than 40%. He developed a rash which was felt possibly due to Brilinta and had intolerance to lisinopril.  He has been treated with Effient and low-dose aspirin since his cardiac event.   A nuclear perfusion study on 05/09/2013 showed reduced LV function with severe hypokinesis to akinesis in the inferior temporal lateral collateral region consistent with his prior infarction. Due to high grade iliac stenosis, he subsequently underwent peripheral intervention by Dr. BGwenlyn Foundto his right common iliac artery on 06/02/2013. He has felt improved and no longer experiences  claudication. He denies any arrhythmias. He denies palpitations.  Last year, his dose of amiodarone was weaned and ultimately discontinued without further arrhythmias.  A followup of duplex scan to his lower extremity in January 2015 suggested an open and patent right common iliac stent without evidence for restenosis.  ABIs were normal bilaterally.  A 1.2.  In January 2015, a followup echo Doppler study showed an ejection fraction of 40-45%.  There was mild mitral regurgitation.  The grade 1 diastolic dysfunction without regional wall motion abnormalities.  Since I last saw him in June 2015, he denies recurrent anginal symptoms.  He denies recurrent claudication.  He denies change in exercise tolerance.  There is no PND or orthopnea.  He's not had recent laboratory.  He presents for evaluation  Past Medical History  Diagnosis Date  . Cardiac arrest, secondary to torsodes with 4 shocks by EMS  03/31/2013  . CAD (coronary artery disease), residual RCA stenosis with recurrent angina 03/31/2013  . S/P angioplasty with stent 03/31/13 to RV ostial marginal with DES Xience Xpedition stent 04/01/2013  . Peripheral vascular disease with claudication     90% ostial right common iliac artery stenosis discovered at the time of cardiac catheterization with an abnormal Doppler study  . Acute MI     2D ECHO, 03/31/2013 - EF 40-45%. normal    Past Surgical History  Procedure Laterality Date  . Fracture surgery      both ankles as a child  . Coronary stent placement  4/27-28/2013    DES to LCx (STEMI culprit); staged PCI to  RCA for post STEMI Angina  . Left heart catheterization with coronary angiogram N/A 03/29/2013    Procedure: LEFT HEART CATHETERIZATION WITH CORONARY ANGIOGRAM;  Surgeon: Troy Sine, MD;  Location: [facility] CATH LAB;  Service: Cardiovascular;  Laterality: N/A;  . Percutaneous coronary stent intervention (pci-s) Bilateral 03/31/2013    Procedure: PERCUTANEOUS CORONARY STENT INTERVENTION (PCI-S);   Surgeon: Troy Sine, MD;  Location: [facility] CATH LAB;  Service: Cardiovascular;  Laterality: Bilateral;  . Lower extremity angiogram N/A 06/02/2013    Procedure: LOWER EXTREMITY ANGIOGRAM;  Surgeon: Lorretta Harp, MD;  Location: [facility] CATH LAB;  Service: Cardiovascular;  Laterality: N/A;  . Percutaneous stent intervention Right 06/02/2013    Procedure: PERCUTANEOUS STENT INTERVENTION;  Surgeon: Lorretta Harp, MD;  Location: [facility] CATH LAB;  Service: Cardiovascular;  Laterality: Right;    Allergies  Allergen Reactions  . Lisinopril Rash    rash  . Ticagrelor Rash    Current Outpatient Prescriptions  Medication Sig Dispense Refill  . aspirin EC 81 MG EC tablet Take 1 tablet (81 mg total) by mouth daily.    Marland Kitchen atorvastatin (LIPITOR) 80 MG tablet TAKE 1 TABLET DAILY AT 6 P.M. 90 tablet 2  . carvedilol (COREG) 12.5 MG tablet TAKE 1 TABLET TWICE A DAY WITH MEALS 180 tablet 2  . famotidine (PEPCID) 20 MG tablet TAKE 1 TABLET DAILY 90 tablet 2  . ibuprofen (ADVIL,MOTRIN) 200 MG tablet Take 400 mg by mouth daily as needed for pain.    . isosorbide mononitrate (IMDUR) 30 MG 24 hr tablet TAKE 1 TABLET DAILY 90 tablet 2  . loratadine (CLARITIN) 10 MG tablet Take 1 tablet (10 mg total) by mouth daily. 30 tablet 0  . magnesium oxide (MAG-OX) 400 (241.3 MG) MG tablet TAKE 1 TABLET BY MOUTH 2 TIMES DAILY 60 tablet 6  . nitroGLYCERIN (NITROSTAT) 0.4 MG SL tablet Place 1 tablet (0.4 mg total) under the tongue every 5 (five) minutes as needed for chest pain. 25 tablet 1  . clopidogrel (PLAVIX) 75 MG tablet Take 1 tablet (75 mg total) by mouth daily. 30 tablet 11   No current facility-administered medications for this visit.    Socially he is married. One child. He does exercise with walking. There is remote tobacco history having quit in April 2013. He does drink alcohol. He did smoke occasional marijuana.  ROS General: Negative; No fevers, chills, or night sweats; no significant weight loss HEENT:  Negative; No changes in vision or hearing, sinus congestion, difficulty swallowing Pulmonary: Negative; No cough, wheezing, shortness of breath, hemoptysis Cardiovascular: Negative; No chest pain, presyncope, syncope, palpitations No claudication symptoms GI: Negative; No nausea, vomiting, diarrhea, or abdominal pain GU: Negative; No dysuria, hematuria, or difficulty voiding Musculoskeletal: Negative; no myalgias, joint pain, or weakness Hematologic/Oncology: Negative; no easy bruising, bleeding Endocrine: Negative; no heat/cold intolerance; no diabetes Neuro: Negative; no changes in balance, headaches Skin: Negative; No rashes or skin lesions Psychiatric: Negative; No behavioral problems, depression Sleep: Negative; No snoring, daytime sleepiness, hypersomnolence, bruxism, restless legs, hypnogognic hallucinations, no cataplexy Other comprehensive 14 point system review is negative.   PE BP 120/82 mmHg  Pulse 62  Ht $R'5\' 10"'JO$  (1.778 m)  Wt 224 lb 4.8 oz (101.742 kg)  BMI 32.18 kg/m2  General: Alert, oriented, no distress.  Skin: normal turgor, no rashes HEENT: Normocephalic, atraumatic. Pupils round and reactive; sclera anicteric;no lid lag.  Nose without nasal septal hypertrophy Mouth/Parynx benign; Mallinpatti scale  3 Neck: No JVD, no carotid bruits with  normal carotid upstroke Lungs: clear to ausculatation and percussion; no wheezing or rales Chest wall: Nontender to palpation Heart: RRR, s1 s2 normal 1/6 sem; no diastolic murmur, no rubs, thrills or heaves. Abdomen: soft, nontender; no hepatosplenomehaly, BS+; abdominal aorta nontender and not dilated by palpation. Back: No CVA tenderness Pulses 2+ Extremities: no clubbing cyanosis or edema, Homan's sign negative  Neurologic: grossly nonfocal Psychological: Normal affect and mood  ECG (independently read by me): Normal sinus rhythm at 62 bpm.  In fear Q wave in 3 nondiagnostic and aVF. Early transition June 2015 ECG: Sinus  bradycardia 52 beats per minute.  Early transition, compatible with his posterior wall MI.  One isolated PVC.  QTc interval 411 milliseconds.  PR interval 152 msec  Prior October 2014 ECG: Normal sinus rhythm at 60 beats per minute. Prominent R wave in V2 suggesting posterior wall involvement. No significant ST changes.  LABS:  BMET  BMP Latest Ref Rng 01/26/2015 05/13/2014 06/03/2013  Glucose 70 - 99 mg/dL 91 85 113(H)  BUN 6 - 23 mg/dL 10 10 11   Creatinine 0.50 - 1.35 mg/dL 0.99 1.04 1.33  Sodium 135 - 145 mEq/L 138 140 137  Potassium 3.5 - 5.3 mEq/L 4.3 4.3 4.4  Chloride 96 - 112 mEq/L 104 106 104  CO2 19 - 32 mEq/L 23 27 23   Calcium 8.4 - 10.5 mg/dL 9.3 9.7 8.9      Hepatic Function Panel   Hepatic Function Latest Ref Rng 01/26/2015 05/13/2014 04/05/2013  Total Protein 6.0 - 8.3 g/dL 6.9 6.8 7.3  Albumin 3.5 - 5.2 g/dL 4.3 4.5 3.4(L)  AST 0 - 37 U/L 21 21 34  ALT 0 - 53 U/L 27 22 51  Alk Phosphatase 39 - 117 U/L 56 50 67  Total Bilirubin 0.2 - 1.2 mg/dL 0.8 0.9 0.8  Bilirubin, Direct 0.0 - 0.3 mg/dL - - 0.3     CBC  CBC Latest Ref Rng 01/26/2015 05/13/2014 06/03/2013  WBC 4.0 - 10.5 K/uL 6.2 6.9 11.0(H)  Hemoglobin 13.0 - 17.0 g/dL 15.4 14.9 14.4  Hematocrit 39.0 - 52.0 % 42.9 43.5 41.1  Platelets 150 - 400 K/uL 162 144(L) 123(L)     BNP No results found for: PROBNP  Lipid Panel     Component Value Date/Time   CHOL 126 01/26/2015 1318   TRIG 119 01/26/2015 1318   HDL 42 01/26/2015 1318   CHOLHDL 3.0 01/26/2015 1318   VLDL 24 01/26/2015 1318   LDLCALC 60 01/26/2015 1318     RADIOLOGY: No results found.    ASSESSMENT AND PLAN: Ricky Roberts is a 49 year old African American gentleman who suffered an ST segment elevation myocardial infarction on 03/29/2013 secondary to circumflex occlusion acutely with VT/VF requiring successful defibrillation and PTCA/stenting of his circumflex vessel with subsequent staged intervention to his right coronary artery several days  later on 03/31/2013. He has not had any arrhythmia since that time. He also was found to have high-grade right common iliac stenosis and is status post successful percutaneous coronary intervention for his peripheral vascular disease. Prior to that procedure, a nuclear study did not demonstrate any ischemia but did show inferior to inferolateral scar according to the circumflex infarction. Of note, ejection fraction on nuclear study was 32%.  A followup echo Doppler study showed an ejection fraction of 40-45% with grade 1 diastolic dysfunction and without definitive wall motion abnormality.  A followup duplex scan of his lower extremity continues to suggest patency of his right common iliac stent.  He is asymptomatic with reference to chest pain.  He is unaware of any arrhythmia.  He is off his amiodarone therapy.  Blood pressure today is stable on carvedilol 12.5 mg twice a day in addition to his nitrate therapy.  He is on Lipitor 80 mg for hyperlipidemia with target LDL less than 70.  He now has been on aspirin and Effient and may have developed a rash secondary to Brilinta.  I have recommended long-term DAPT therapy Since it is almost 2 years since his initial event I will switch his Effient when he runs out of his current dose to Plavix 75 mg and  1-2 weeks later, he will undergo PTY12 testing to make certain his Plavix sensitive.  Repeat laboratory will be obtained.  I will see him in 6 months for reevaluation.  Time spent: 25 minutes  Troy Sine, MD, [facility]  01/28/2015 3:09 PM

## 2015-02-13 ENCOUNTER — Other Ambulatory Visit: Payer: Self-pay | Admitting: Cardiovascular Disease

## 2015-02-15 NOTE — Telephone Encounter (Signed)
Rx refill sent to patient pharmacy   

## 2015-03-05 ENCOUNTER — Other Ambulatory Visit (HOSPITAL_COMMUNITY)
Admission: AD | Admit: 2015-03-05 | Discharge: 2015-03-05 | Disposition: A | Discharge status: Home / Self Care | Payer: BLUE CROSS/BLUE SHIELD | Source: Ambulatory Visit | Attending: Cardiovascular Disease | Admitting: Cardiovascular Disease

## 2015-03-05 DIAGNOSIS — D691 Qualitative platelet defects: Secondary | ICD-10-CM | POA: Insufficient documentation

## 2015-03-05 LAB — PLATELET INHIBITION P2Y12: PLATELET FUNCTION P2Y12: 187 [PRU] — AB (ref 194–418)

## 2015-06-18 ENCOUNTER — Other Ambulatory Visit: Payer: Self-pay | Admitting: Cardiovascular Disease

## 2015-06-18 NOTE — Telephone Encounter (Signed)
Rx(s) sent to pharmacy electronically.  

## 2015-11-11 ENCOUNTER — Other Ambulatory Visit: Payer: Self-pay | Admitting: Cardiovascular Disease

## 2015-11-11 NOTE — Telephone Encounter (Signed)
Rx(s) sent to pharmacy electronically.  

## 2016-01-14 ENCOUNTER — Other Ambulatory Visit: Payer: Self-pay | Admitting: Cardiovascular Disease

## 2016-01-14 NOTE — Telephone Encounter (Signed)
Rx(s) sent to pharmacy electronically. OV 03/17/2016

## 2016-01-17 ENCOUNTER — Other Ambulatory Visit: Payer: Self-pay | Admitting: Cardiovascular Disease

## 2016-01-17 DIAGNOSIS — I739 Peripheral vascular disease, unspecified: Secondary | ICD-10-CM

## 2016-01-20 ENCOUNTER — Ambulatory Visit (HOSPITAL_COMMUNITY)
Admission: RE | Admit: 2016-01-20 | Discharge: 2016-01-20 | Disposition: A | Discharge status: Home / Self Care | Payer: BLUE CROSS/BLUE SHIELD | Source: Ambulatory Visit | Attending: Cardiology | Admitting: Cardiology

## 2016-01-20 DIAGNOSIS — I739 Peripheral vascular disease, unspecified: Secondary | ICD-10-CM

## 2016-01-20 DIAGNOSIS — I708 Atherosclerosis of other arteries: Secondary | ICD-10-CM | POA: Insufficient documentation

## 2016-01-21 ENCOUNTER — Telehealth: Payer: Self-pay

## 2016-01-21 DIAGNOSIS — I739 Peripheral vascular disease, unspecified: Secondary | ICD-10-CM

## 2016-01-21 NOTE — Telephone Encounter (Signed)
Pt scheduled for annual dopplers

## 2016-02-09 ENCOUNTER — Other Ambulatory Visit: Payer: Self-pay | Admitting: Cardiovascular Disease

## 2016-02-09 NOTE — Telephone Encounter (Signed)
REFILL 

## 2016-02-16 ENCOUNTER — Other Ambulatory Visit: Payer: Self-pay | Admitting: Cardiovascular Disease

## 2016-02-16 NOTE — Telephone Encounter (Signed)
REFILL 

## 2016-03-17 ENCOUNTER — Ambulatory Visit (INDEPENDENT_AMBULATORY_CARE_PROVIDER_SITE_OTHER): Payer: BLUE CROSS/BLUE SHIELD | Admitting: Cardiovascular Disease

## 2016-03-17 VITALS — BP 116/72 | HR 53 | Ht 69.0 in | Wt 216.2 lb

## 2016-03-17 DIAGNOSIS — G4730 Sleep apnea, unspecified: Secondary | ICD-10-CM

## 2016-03-17 DIAGNOSIS — G4733 Obstructive sleep apnea (adult) (pediatric): Secondary | ICD-10-CM

## 2016-03-17 DIAGNOSIS — Z79899 Other long term (current) drug therapy: Secondary | ICD-10-CM | POA: Diagnosis not present

## 2016-03-17 DIAGNOSIS — I2510 Atherosclerotic heart disease of native coronary artery without angina pectoris: Secondary | ICD-10-CM

## 2016-03-17 DIAGNOSIS — Z955 Presence of coronary angioplasty implant and graft: Secondary | ICD-10-CM

## 2016-03-17 DIAGNOSIS — E669 Obesity, unspecified: Secondary | ICD-10-CM

## 2016-03-17 DIAGNOSIS — E785 Hyperlipidemia, unspecified: Secondary | ICD-10-CM

## 2016-03-17 LAB — LIPID PANEL
CHOLESTEROL: 139 mg/dL (ref 125–200)
HDL: 45 mg/dL (ref >=40)
LDL CALC: 69 mg/dL (ref <130)
TRIGLYCERIDES: 124 mg/dL (ref <150)
Total CHOL/HDL Ratio: 3.1 Ratio (ref <=5.0)
VLDL: 25 mg/dL (ref <30)

## 2016-03-17 LAB — CBC
HEMATOCRIT: 45.4 % (ref 38.5–50.0)
HEMOGLOBIN: 15.9 g/dL (ref 13.2–17.1)
MCH: 30.8 pg (ref 27.0–33.0)
MCHC: 35 g/dL (ref 32.0–36.0)
MCV: 88 fL (ref 80.0–100.0)
MPV: 10.9 fL (ref 7.5–12.5)
Platelets: 148 10*3/uL (ref 140–400)
RBC: 5.16 MIL/uL (ref 4.20–5.80)
RDW: 14.3 % (ref 11.0–15.0)
WBC: 7.1 10*3/uL (ref 3.8–10.8)

## 2016-03-17 LAB — HEPATIC FUNCTION PANEL
ALK PHOS: 63 U/L (ref 40–115)
ALT: 33 U/L (ref 9–46)
AST: 25 U/L (ref 10–40)
Albumin: 4.6 g/dL (ref 3.6–5.1)
BILIRUBIN DIRECT: 0.2 mg/dL (ref <=0.2)
BILIRUBIN INDIRECT: 0.7 mg/dL (ref 0.2–1.2)
BILIRUBIN TOTAL: 0.9 mg/dL (ref 0.2–1.2)
Total Protein: 6.8 g/dL (ref 6.1–8.1)

## 2016-03-17 LAB — TSH: TSH: 2 m[IU]/L (ref 0.40–4.50)

## 2016-03-17 MED ORDER — CLOPIDOGREL BISULFATE 75 MG PO TABS: 75.0000 mg | ORAL_TABLET | Freq: Every day | ORAL | Status: DC

## 2016-03-17 NOTE — Patient Instructions (Addendum)
Your physician wants you to follow-up in: 5-6 Months. You will receive a reminder letter in the mail two months in advance. If you don't receive a letter, please call our office to schedule the follow-up appointment.  Your physician recommends that you return for lab work in: Fasting Lipids Liver, TSH, CBC  Your physician has recommended that you have a sleep study. This test records several body functions during sleep, including: brain activity, eye movement, oxygen and carbon dioxide blood levels, heart rate and rhythm, breathing rate and rhythm, the flow of air through your mouth and nose, snoring, body muscle movements, and chest and belly movement.                                                               Happy Ricky Roberts

## 2016-03-18 ENCOUNTER — Other Ambulatory Visit: Payer: Self-pay | Admitting: Cardiovascular Disease

## 2016-03-20 ENCOUNTER — Encounter: Payer: Self-pay | Admitting: Cardiovascular Disease

## 2016-03-20 DIAGNOSIS — G4733 Obstructive sleep apnea (adult) (pediatric): Secondary | ICD-10-CM | POA: Insufficient documentation

## 2016-03-20 NOTE — Telephone Encounter (Signed)
Rx(s) sent to pharmacy electronically.  

## 2016-03-20 NOTE — Progress Notes (Signed)
Patient ID: Ricky Roberts, male   DOB: [birth year], 50 y.o.   MRN: [id number]     HPI: Ricky Roberts is a 50 y.o. male who presents to the office for an 25 month cardiology evaluation.   Mr. Kos suffered an an acute coronary syndrome/ST segment elevation myocardial infarction inferolaterally in the setting of a cardiac arrest secondary to torsade du pointes requiring defibrillation on 03/29/2013.  He was taken emergently to the cardiac catheterization laboratory by me and found  to have a totally occluded proximal circumflex and 85-90% stenosis in his mid RCA. He underwent successful intervention to his circumflex vessel after undergoing thrombectomy which removed significant thrombus. A Xience Xpedition 3.0x15 mm DES stent was inserted which was postdilated 3.3 mm. He was also noted to have a 90% stenosis in his distal circumflex which underwent PTCA and was reduced to less than 30%. There also is mild disease in the OM1 vessel of 50 and 30%.  He left the laboratory with TIMI 3 flow. 2 days later he underwent staged PCI to his RCA.   His circumflex stent was found to be widely patent. He underwent  angiosculpt cutting balloon, and ultimate stenting of the RCA with a 3.25x18 Xience DES stent postdilated to 3.5 mm the 85-90% stenosis reduced to percent. He also underwent PTCA of the acute marginal branch through the stent strut which improved from 95% to less than 40%. He developed a rash which was felt possibly due to Brilinta and had intolerance to lisinopril.  He has been treated with Effient and low-dose aspirin since his cardiac event.   A nuclear perfusion study on 05/09/2013 showed reduced LV function with severe hypokinesis to akinesis in the inferior temporal lateral collateral region consistent with his prior infarction. Due to high grade iliac stenosis, he subsequently underwent peripheral intervention by Dr. Gwenlyn Found to his right common iliac artery on 06/02/2013. He has felt improved and no longer  experiences claudication. He denies any arrhythmias. He denies palpitations.  Last year, his dose of amiodarone was weaned and ultimately discontinued without further arrhythmias.  A followup of duplex scan to his lower extremity in January 2015 suggested an open and patent right common iliac stent without evidence for restenosis.  ABIs were normal bilaterally.  A 1.2.  In January 2015, a followup echo Doppler study showed an ejection fraction of 40-45%.  There was mild mitral regurgitation.  The grade 1 diastolic dysfunction without regional wall motion abnormalities.  Since I last saw him in February 2016, he denies recurrent anginal symptoms.  He denies recurrent claudication.  He denies change in exercise tolerance.  There is no PND or orthopnea.  He's not had recent laboratory.  He admits to fatigue.  He also admits to daytime sleepiness.  Upon further questioning, he is not sleeping well and has frequent awakenings and has been told that he snores loudly.  He is unaware of any palpitations.  He presents for evaluation  Past Medical History  Diagnosis Date  . Cardiac arrest, secondary to torsodes with 4 shocks by EMS  03/31/2013  . CAD (coronary artery disease), residual RCA stenosis with recurrent angina 03/31/2013  . S/P angioplasty with stent 03/31/13 to RV ostial marginal with DES Xience Xpedition stent 04/01/2013  . Peripheral vascular disease with claudication (HCC)     90% ostial right common iliac artery stenosis discovered at the time of cardiac catheterization with an abnormal Doppler study  . Acute MI ([place])     2D ECHO, 03/31/2013 -  EF 40-45%. normal    Past Surgical History  Procedure Laterality Date  . Fracture surgery      both ankles as a child  . Coronary stent placement  4/27-28/2013    DES to LCx (STEMI culprit); staged PCI to RCA for post STEMI Angina  . Left heart catheterization with coronary angiogram N/A 03/29/2013    Procedure: LEFT HEART CATHETERIZATION WITH CORONARY  ANGIOGRAM;  Surgeon: Troy Sine, MD;  Location: [facility] CATH LAB;  Service: Cardiovascular;  Laterality: N/A;  . Percutaneous coronary stent intervention (pci-s) Bilateral 03/31/2013    Procedure: PERCUTANEOUS CORONARY STENT INTERVENTION (PCI-S);  Surgeon: Troy Sine, MD;  Location: [facility] CATH LAB;  Service: Cardiovascular;  Laterality: Bilateral;  . Lower extremity angiogram N/A 06/02/2013    Procedure: LOWER EXTREMITY ANGIOGRAM;  Surgeon: Lorretta Harp, MD;  Location: [facility] CATH LAB;  Service: Cardiovascular;  Laterality: N/A;  . Percutaneous stent intervention Right 06/02/2013    Procedure: PERCUTANEOUS STENT INTERVENTION;  Surgeon: Lorretta Harp, MD;  Location: [facility] CATH LAB;  Service: Cardiovascular;  Laterality: Right;    Allergies  Allergen Reactions  . Lisinopril Rash    rash  . Ticagrelor Rash    Current Outpatient Prescriptions  Medication Sig Dispense Refill  . aspirin EC 81 MG EC tablet Take 1 tablet (81 mg total) by mouth daily.    Marland Kitchen atorvastatin (LIPITOR) 80 MG tablet TAKE 1 TABLET DAILY AT 6:00PM 90 tablet 2  . carvedilol (COREG) 12.5 MG tablet TAKE 1 TABLET TWICE A DAY WITH MEALS 180 tablet 0  . clopidogrel (PLAVIX) 75 MG tablet Take 1 tablet (75 mg total) by mouth daily. 90 tablet 3  . famotidine (PEPCID) 20 MG tablet TAKE 1 TABLET DAILY 90 tablet 0  . ibuprofen (ADVIL,MOTRIN) 200 MG tablet Take 400 mg by mouth daily as needed for pain.    . isosorbide mononitrate (IMDUR) 30 MG 24 hr tablet TAKE 1 TABLET DAILY 90 tablet 0  . loratadine (CLARITIN) 10 MG tablet Take 1 tablet (10 mg total) by mouth daily. 30 tablet 0  . nitroGLYCERIN (NITROSTAT) 0.4 MG SL tablet Place 1 tablet (0.4 mg total) under the tongue every 5 (five) minutes as needed for chest pain. 25 tablet 1  . magnesium oxide (MAG-OX) 400 (241.3 Mg) MG tablet TAKE 1 TABLET BY MOUTH TWICE A DAY 60 tablet 11   No current facility-administered medications for this visit.    Socially he is married. One child. He  does exercise with walking. There is remote tobacco history having quit in April 2013. He does drink alcohol. He did smoke occasional marijuana.  ROS General: Negative; No fevers, chills, or night sweats; no significant weight loss HEENT: Negative; No changes in vision or hearing, sinus congestion, difficulty swallowing Pulmonary: Negative; No cough, wheezing, shortness of breath, hemoptysis Cardiovascular: See history of present illness No claudication symptoms GI: Negative; No nausea, vomiting, diarrhea, or abdominal pain GU: Negative; No dysuria, hematuria, or difficulty voiding Musculoskeletal: Negative; no myalgias, joint pain, or weakness Hematologic/Oncology: Negative; no easy bruising, bleeding Endocrine: Negative; no heat/cold intolerance; no diabetes Neuro: Negative; no changes in balance, headaches Skin: Negative; No rashes or skin lesions Psychiatric: Negative; No behavioral problems, depression Sleep: Positive for snoring, daytime sleepiness, hypersomnolence, no bruxism, restless legs, hypnogognic hallucinations, no cataplexy Other comprehensive 14 point system review is negative.   PE BP 116/72 mmHg  Pulse 53  Ht '5\' 9"'$  (1.753 m)  Wt 216 lb 3.2 oz (98.068 kg)  BMI 31.91  kg/m2   Wt Readings from Last 3 Encounters:  03/17/16 216 lb 3.2 oz (98.068 kg)  01/26/15 224 lb 4.8 oz (101.742 kg)  05/06/14 218 lb 14.4 oz (99.292 kg)   General: Alert, oriented, no distress.  Skin: normal turgor, no rashes HEENT: Normocephalic, atraumatic. Pupils round and reactive; sclera anicteric;no lid lag.  Nose without nasal septal hypertrophy Mouth/Parynx benign; Mallinpatti scale  3 Neck: No JVD, no carotid bruits with normal carotid upstroke Lungs: clear to ausculatation and percussion; no wheezing or rales Chest wall: Nontender to palpation Heart: RRR, s1 s2 normal 1/6 sem; no diastolic murmur, no rubs, thrills or heaves. Abdomen: soft, nontender; no hepatosplenomehaly, BS+;  abdominal aorta nontender and not dilated by palpation. Back: No CVA tenderness Pulses 2+ Extremities: no clubbing cyanosis or edema, Homan's sign negative  Neurologic: grossly nonfocal Psychological: Normal affect and mood  ECG (independently read by me): Sinus bradycardia at 53 bpm with mild sinus arrhythmia.  Early transition in V2.  Nonspecific T changes.  February 2016 ECG (independently read by me): Normal sinus rhythm at 62 bpm.  In fear Q wave in 3 nondiagnostic and aVF. Early transition  June 2015 ECG: Sinus bradycardia 52 beats per minute.  Early transition, compatible with his posterior wall MI.  One isolated PVC.  QTc interval 411 milliseconds.  PR interval 152 msec  Prior October 2014 ECG: Normal sinus rhythm at 60 beats per minute. Prominent R wave in V2 suggesting posterior wall involvement. No significant ST changes.  LABS:  BMP Latest Ref Rng 01/26/2015 05/13/2014 06/03/2013  Glucose 70 - 99 mg/dL 91 85 113(H)  BUN 6 - 23 mg/dL 10 10 11   Creatinine 0.50 - 1.35 mg/dL 0.99 1.04 1.33  Sodium 135 - 145 mEq/L 138 140 137  Potassium 3.5 - 5.3 mEq/L 4.3 4.3 4.4  Chloride 96 - 112 mEq/L 104 106 104  CO2 19 - 32 mEq/L 23 27 23   Calcium 8.4 - 10.5 mg/dL 9.3 9.7 8.9     Hepatic Function Latest Ref Rng 03/17/2016 01/26/2015 05/13/2014  Total Protein 6.1 - 8.1 g/dL 6.8 6.9 6.8  Albumin 3.6 - 5.1 g/dL 4.6 4.3 4.5  AST 10 - 40 U/L 25 21 21   ALT 9 - 46 U/L 33 27 22  Alk Phosphatase 40 - 115 U/L 63 56 50  Total Bilirubin 0.2 - 1.2 mg/dL 0.9 0.8 0.9  Bilirubin, Direct <=0.2 mg/dL 0.2 - -     CBC Latest Ref Rng 03/17/2016 01/26/2015 05/13/2014  WBC 3.8 - 10.8 K/uL 7.1 6.2 6.9  Hemoglobin 13.2 - 17.1 g/dL 15.9 15.4 14.9  Hematocrit 38.5 - 50.0 % 45.4 42.9 43.5  Platelets 140 - 400 K/uL 148 162 144(L)   Lab Results  Component Value Date   MCV 88.0 03/17/2016   MCV 85.6 01/26/2015   MCV 89.3 05/13/2014    Lab Results  Component Value Date   TSH 2.00 03/17/2016   Lab Results    Component Value Date   HGBA1C 5.4 04/01/2013   Lipid Panel     Component Value Date/Time   CHOL 139 03/17/2016 1004   TRIG 124 03/17/2016 1004   HDL 45 03/17/2016 1004   CHOLHDL 3.1 03/17/2016 1004   VLDL 25 03/17/2016 1004   LDLCALC 69 03/17/2016 1004     RADIOLOGY: No results found.    ASSESSMENT AND PLAN: Mr. Grzywacz is a 50 year old African American gentleman who suffered an ST segment elevation myocardial infarction on 03/29/2013 secondary to circumflex occlusion acutely with  VT/VF requiring successful defibrillation and PTCA/stenting of his circumflex vessel with subsequent staged intervention to his right coronary artery several days later on 03/31/2013. He has not had any arrhythmia since that time. He also was found to have high-grade right common iliac stenosis and is status post successful percutaneous coronary intervention for his peripheral vascular disease. Prior to that procedure, a nuclear study did not demonstrate any ischemia but did show inferior to inferolateral scar c/w the circumflex infarction. Of note, ejection fraction on nuclear study was 32%.  A followup echo Doppler study showed an ejection fraction of 40-45% with grade 1 diastolic dysfunction and without definitive wall motion abnormality.  A followup duplex scan of his lower extremity continues to suggest patency of his right common iliac stent.  He continues to be asymptomatic with reference to recurrent chest pain.  His blood pressure today is controlled on his regimen consisting of carvedilol 12.5 twice a day, isosorbide mononitrate 30 mg daily.  He continues to be on dual antiplatelet therapy with aspirin and Plavix and denies any bleeding.  I'm concerned that he may have obstructive sleep apnea.  His symptom complex is positive for snoring, daytime fatigue, poor sleep, and frequent awakenings.  He is obese with a body mass index of 31.91.  I'm scheduling him to undergo a sleep study for further evaluation.  He  is fasting today and laboratory was obtained.  On his current dose of atorvastatin 80 mg LDL cholesterol is excellent at 69 with total cholesterol of 139.   I will see him in follow-up in approximately 5 months for further evaluation.  If he tests positive for sleep apnea, CPAP titration will be initiated and I will see him in sleep clinic required for compliance documentation.  Troy Sine, MD, [facility]  03/20/2016 6:20 PM

## 2016-05-09 ENCOUNTER — Other Ambulatory Visit: Payer: Self-pay | Admitting: Cardiovascular Disease

## 2016-05-09 NOTE — Telephone Encounter (Signed)
Rx(s) sent to pharmacy electronically.  

## 2016-05-11 ENCOUNTER — Ambulatory Visit (HOSPITAL_BASED_OUTPATIENT_CLINIC_OR_DEPARTMENT_OTHER): Payer: BLUE CROSS/BLUE SHIELD | Attending: Cardiovascular Disease | Admitting: Cardiovascular Disease

## 2016-05-11 VITALS — Ht 70.0 in | Wt 215.0 lb

## 2016-05-11 DIAGNOSIS — G4733 Obstructive sleep apnea (adult) (pediatric): Secondary | ICD-10-CM | POA: Diagnosis not present

## 2016-05-11 DIAGNOSIS — Z6831 Body mass index (BMI) 31.0-31.9, adult: Secondary | ICD-10-CM | POA: Diagnosis not present

## 2016-05-11 DIAGNOSIS — G4719 Other hypersomnia: Secondary | ICD-10-CM | POA: Diagnosis not present

## 2016-05-11 DIAGNOSIS — Z79899 Other long term (current) drug therapy: Secondary | ICD-10-CM | POA: Insufficient documentation

## 2016-05-11 DIAGNOSIS — Z7982 Long term (current) use of aspirin: Secondary | ICD-10-CM | POA: Diagnosis not present

## 2016-05-11 DIAGNOSIS — G4730 Sleep apnea, unspecified: Secondary | ICD-10-CM

## 2016-05-11 DIAGNOSIS — R0683 Snoring: Secondary | ICD-10-CM | POA: Insufficient documentation

## 2016-05-11 DIAGNOSIS — Z7902 Long term (current) use of antithrombotics/antiplatelets: Secondary | ICD-10-CM | POA: Insufficient documentation

## 2016-05-11 DIAGNOSIS — I493 Ventricular premature depolarization: Secondary | ICD-10-CM | POA: Diagnosis not present

## 2016-05-11 DIAGNOSIS — E669 Obesity, unspecified: Secondary | ICD-10-CM | POA: Diagnosis not present

## 2016-05-13 ENCOUNTER — Encounter (HOSPITAL_BASED_OUTPATIENT_CLINIC_OR_DEPARTMENT_OTHER): Payer: Self-pay | Admitting: Cardiovascular Disease

## 2016-05-13 NOTE — Procedures (Signed)
Patient Name: Ricky Roberts, Ricky Roberts Date: 05/11/2016 Gender: Male D.O.B: 01/03/1966 Age (years): 59 Referring Provider: Shelva Majestic MD, ABSM Height (inches): 70 Interpreting Physician: Shelva Majestic MD, ABSM Weight (lbs): 215 RPSGT: Laren Everts BMI: 31 MRN: [id number] Neck Size: 16.00  CLINICAL INFORMATION Sleep Study Type: Split Night CPAP Indication for sleep study: Excessive Daytime Sleepiness, Obesity, Snoring Epworth Sleepiness Score: 11  SLEEP STUDY TECHNIQUE As per the AASM Manual for the Scoring of Sleep and Associated Events v2.3 (April 2016) with a hypopnea requiring 4% desaturations. The channels recorded and monitored were frontal, central and occipital EEG, electrooculogram (EOG), submentalis EMG (chin), nasal and oral airflow, thoracic and abdominal wall motion, anterior tibialis EMG, snore microphone, electrocardiogram, and pulse oximetry. Continuous positive airway pressure (CPAP) was initiated when the patient met split night criteria and was titrated according to treat sleep-disordered breathing.  MEDICATIONS  aspirin EC 81 MG EC tablet 81 mg, Daily     atorvastatin (LIPITOR) 80 MG tablet      carvedilol (COREG) 12.5 MG tablet      clopidogrel (PLAVIX) 75 MG tablet 75 mg, Daily     famotidine (PEPCID) 20 MG tablet      ibuprofen (ADVIL,MOTRIN) 200 MG tablet 400 mg, Daily PRN     isosorbide mononitrate (IMDUR) 30 MG 24 hr tablet      loratadine (CLARITIN) 10 MG tablet 10 mg, Daily     magnesium oxide (MAG-OX) 400 (241.3 Mg) MG tablet      nitroGLYCERIN (NITROSTAT) 0.4 MG SL tablet   Medications administered by patient during sleep study : No sleep medicine administered.  RESPIRATORY PARAMETERS Diagnostic Total AHI (/hr): 24.6 RDI (/hr): 34.8 OA Index (/hr): 0.5 CA Index (/hr): 0.0 REM AHI (/hr): 41.4 NREM AHI (/hr): 22.4 Supine AHI (/hr): 33.8 Non-supine AHI (/hr): 0.00 Min O2 Sat (%): 85.00 Mean O2 (%): 93.75 Time below 88%  (min): 1.5   Titration Optimal Pressure (cm): 6 AHI at Optimal Pressure (/hr): 1.8 Min O2 at Optimal Pressure (%): 92.0 Supine % at Optimal (%): 0 Sleep % at Optimal (%): 97   SLEEP ARCHITECTURE The recording time for the entire night was 392.4 minutes. During a baseline period of 200.6 minutes, the patient slept for 124.2 minutes in REM and nonREM, yielding a sleep efficiency of 61.9%. Sleep onset after lights out was 10.9 minutes with a REM latency of 45.5 minutes. The patient spent 35.03% of the night in stage N1 sleep, 53.30% in stage N2 sleep, 0.00% in stage N3 and 11.68% in REM. During the titration period of 187.9 minutes, the patient slept for 104.0 minutes in REM and nonREM, yielding a sleep efficiency of 55.4%. Sleep onset after CPAP initiation was 23.9 minutes with a REM latency of 83.0 minutes. The patient spent 19.71% of the night in stage N1 sleep, 15.87% in stage N2 sleep, 0.00% in stage N3 and 64.42% in REM.  CARDIAC DATA The 2 lead EKG demonstrated sinus rhythm. The mean heart rate was 58.67 beats per minute. Other EKG findings include: PVCs.  LEG MOVEMENT DATA The total Periodic Limb Movements of Sleep (PLMS) were 31. The PLMS index was 8.12 .  IMPRESSIONS - Moderate obstructive sleep apnea overall(AHI = 24.6/h);  however, event were severe with supine sleep (AHI33.8/h) and during REM sleep (AHI 41.4/h). - The patient was only titrated to 6 cm water pressure with an AHI of 1.8/h.  - No significant central sleep apnea occurred during the diagnostic portion of the study (CAI = 0.0/hour). -  Reduced sleep efficiency. - Moderate oxygen desaturation to a nadir of 85% during REM sleep. - The patient snored with Moderate snoring volume during the diagnostic portion of the study. - EKG findings include PVCs. - The arousal index was abnormal. - Clinically significant periodic limb movements did not occur during sleep.  DIAGNOSIS - Obstructive Sleep Apnea (327.23 [G47.33  ICD-10])  RECOMMENDATIONS - Recommend CPAP therapy at an initial pressure 7 cm H2O with heated humidification. - Avoid alcohol, sedatives and other CNS depressants that may worsen sleep apnea and disrupt normal sleep architecture. - Sleep hygiene should be reviewed to assess factors that may improve sleep quality. - Weight management and regular exercise should be initiated or continued. - Recommend download in 30 days and sleep clinic evaluation.   Troy Sine, MD, [place], American Board of Sleep Medicine  ELECTRONICALLY SIGNED ON:  05/13/2016, 7:52 PM [place] PH: [phone]   FX: [phone] [place]

## 2016-05-15 ENCOUNTER — Other Ambulatory Visit (HOSPITAL_BASED_OUTPATIENT_CLINIC_OR_DEPARTMENT_OTHER): Payer: Self-pay

## 2016-05-15 DIAGNOSIS — G4730 Sleep apnea, unspecified: Secondary | ICD-10-CM

## 2016-05-23 ENCOUNTER — Telehealth: Payer: Self-pay | Admitting: *Deleted

## 2016-05-23 NOTE — Telephone Encounter (Signed)
Left sleep study results and recommendations on patient's cell VM.

## 2016-05-23 NOTE — Telephone Encounter (Signed)
Faxed  referral and order to [facility] for CPAP set up.

## 2016-08-07 ENCOUNTER — Other Ambulatory Visit: Payer: Self-pay | Admitting: Cardiovascular Disease

## 2016-08-08 NOTE — Telephone Encounter (Signed)
Rx(s) sent to pharmacy electronically.  

## 2016-09-26 ENCOUNTER — Ambulatory Visit: Payer: BLUE CROSS/BLUE SHIELD | Admitting: Cardiovascular Disease

## 2016-11-07 ENCOUNTER — Other Ambulatory Visit: Payer: Self-pay | Admitting: Cardiovascular Disease

## 2016-11-07 NOTE — Telephone Encounter (Signed)
Rx(s) sent to pharmacy electronically.  

## 2016-12-14 ENCOUNTER — Other Ambulatory Visit: Payer: Self-pay | Admitting: *Deleted

## 2016-12-14 MED ORDER — ISOSORBIDE MONONITRATE ER 30 MG PO TB24: 30.0000 mg | ORAL_TABLET | Freq: Every day | ORAL | 0 refills | Status: DC

## 2016-12-14 MED ORDER — FAMOTIDINE 20 MG PO TABS: 20.0000 mg | ORAL_TABLET | Freq: Every day | ORAL | 0 refills | Status: DC

## 2016-12-14 MED ORDER — CARVEDILOL 12.5 MG PO TABS: 12.5000 mg | ORAL_TABLET | Freq: Two times a day (BID) | ORAL | 0 refills | Status: DC

## 2016-12-14 MED ORDER — CLOPIDOGREL BISULFATE 75 MG PO TABS: 75.0000 mg | ORAL_TABLET | Freq: Every day | ORAL | 0 refills | Status: DC

## 2017-01-04 ENCOUNTER — Other Ambulatory Visit: Payer: Self-pay | Admitting: Cardiovascular Disease

## 2017-01-04 DIAGNOSIS — I739 Peripheral vascular disease, unspecified: Secondary | ICD-10-CM

## 2017-01-05 ENCOUNTER — Other Ambulatory Visit: Payer: Self-pay | Admitting: Cardiovascular Disease

## 2017-01-05 DIAGNOSIS — I739 Peripheral vascular disease, unspecified: Secondary | ICD-10-CM

## 2017-01-23 ENCOUNTER — Ambulatory Visit (HOSPITAL_COMMUNITY)
Admission: RE | Admit: 2017-01-23 | Discharge: 2017-01-23 | Disposition: A | Discharge status: Home / Self Care | Payer: BLUE CROSS/BLUE SHIELD | Source: Ambulatory Visit | Attending: Cardiology | Admitting: Cardiology

## 2017-01-23 ENCOUNTER — Encounter (HOSPITAL_COMMUNITY): Payer: BLUE CROSS/BLUE SHIELD

## 2017-01-23 ENCOUNTER — Inpatient Hospital Stay (HOSPITAL_COMMUNITY): Admission: RE | Admit: 2017-01-23 | Payer: BLUE CROSS/BLUE SHIELD | Source: Ambulatory Visit

## 2017-01-23 DIAGNOSIS — Z87891 Personal history of nicotine dependence: Secondary | ICD-10-CM | POA: Insufficient documentation

## 2017-01-23 DIAGNOSIS — E785 Hyperlipidemia, unspecified: Secondary | ICD-10-CM | POA: Insufficient documentation

## 2017-01-23 DIAGNOSIS — I739 Peripheral vascular disease, unspecified: Secondary | ICD-10-CM | POA: Diagnosis present

## 2017-01-23 DIAGNOSIS — Z9862 Peripheral vascular angioplasty status: Secondary | ICD-10-CM | POA: Insufficient documentation

## 2017-01-23 DIAGNOSIS — I2510 Atherosclerotic heart disease of native coronary artery without angina pectoris: Secondary | ICD-10-CM | POA: Insufficient documentation

## 2017-01-25 ENCOUNTER — Other Ambulatory Visit: Payer: Self-pay | Admitting: Cardiovascular Disease

## 2017-01-25 DIAGNOSIS — I739 Peripheral vascular disease, unspecified: Secondary | ICD-10-CM

## 2017-03-04 ENCOUNTER — Other Ambulatory Visit: Payer: Self-pay | Admitting: Cardiovascular Disease

## 2017-03-15 ENCOUNTER — Other Ambulatory Visit: Payer: Self-pay | Admitting: *Deleted

## 2017-03-15 MED ORDER — CARVEDILOL 12.5 MG PO TABS: 12.5000 mg | ORAL_TABLET | Freq: Two times a day (BID) | ORAL | 0 refills | Status: DC

## 2017-05-01 ENCOUNTER — Other Ambulatory Visit: Payer: Self-pay | Admitting: *Deleted

## 2017-05-01 MED ORDER — CARVEDILOL 12.5 MG PO TABS: 12.5000 mg | ORAL_TABLET | Freq: Two times a day (BID) | ORAL | 0 refills | Status: DC

## 2017-05-06 ENCOUNTER — Other Ambulatory Visit: Payer: Self-pay | Admitting: Cardiovascular Disease

## 2017-05-07 NOTE — Telephone Encounter (Signed)
REFILL 

## 2017-06-02 ENCOUNTER — Other Ambulatory Visit: Payer: Self-pay | Admitting: Cardiovascular Disease

## 2017-06-11 ENCOUNTER — Other Ambulatory Visit: Payer: Self-pay | Admitting: Cardiovascular Disease

## 2017-07-09 ENCOUNTER — Ambulatory Visit (INDEPENDENT_AMBULATORY_CARE_PROVIDER_SITE_OTHER): Payer: BLUE CROSS/BLUE SHIELD | Admitting: Cardiovascular Disease

## 2017-07-09 ENCOUNTER — Encounter: Payer: Self-pay | Admitting: Cardiovascular Disease

## 2017-07-09 VITALS — BP 109/71 | HR 75 | Ht 70.0 in | Wt 219.2 lb

## 2017-07-09 DIAGNOSIS — Z955 Presence of coronary angioplasty implant and graft: Secondary | ICD-10-CM

## 2017-07-09 DIAGNOSIS — E785 Hyperlipidemia, unspecified: Secondary | ICD-10-CM

## 2017-07-09 DIAGNOSIS — G4733 Obstructive sleep apnea (adult) (pediatric): Secondary | ICD-10-CM

## 2017-07-09 DIAGNOSIS — E669 Obesity, unspecified: Secondary | ICD-10-CM

## 2017-07-09 DIAGNOSIS — I2510 Atherosclerotic heart disease of native coronary artery without angina pectoris: Secondary | ICD-10-CM | POA: Diagnosis not present

## 2017-07-09 NOTE — Progress Notes (Signed)
Patient ID: Ryen Rhames, male   DOB: 19-Jun-1966, 51 y.o.   MRN: [id number]     HPI: Daxten Kovalenko is a 51 y.o. male who presents to the office for an 41 month cardiology evaluation.   Mr. Patil suffered an an acute coronary syndrome/ST segment elevation myocardial infarction inferolaterally in the setting of a cardiac arrest secondary to torsade du pointes requiring defibrillation on 03/29/2013.  He was taken emergently to the cardiac catheterization laboratory by me and found  to have a totally occluded proximal circumflex and 85-90% stenosis in his mid RCA. He underwent successful intervention to his circumflex vessel after undergoing thrombectomy which removed significant thrombus. A Xience Xpedition 3.0x15 mm DES stent was inserted which was postdilated 3.3 mm. He was also noted to have a 90% stenosis in his distal circumflex which underwent PTCA and was reduced to less than 30%. There also is mild disease in the OM1 vessel of 50 and 30%.  He left the laboratory with TIMI 3 flow. 2 days later he underwent staged PCI to his RCA.   His circumflex stent was found to be widely patent. He underwent  angiosculpt cutting balloon, and ultimate stenting of the RCA with a 3.25x18 Xience DES stent postdilated to 3.5 mm the 85-90% stenosis reduced to percent. He also underwent PTCA of the acute marginal branch through the stent strut which improved from 95% to less than 40%. He developed a rash which was felt possibly due to Brilinta and had intolerance to lisinopril.  He has been treated with Effient and low-dose aspirin since his cardiac event.   A nuclear perfusion study on 05/09/2013 showed reduced LV function with severe hypokinesis to akinesis in the inferior temporal lateral collateral region consistent with his prior infarction. Due to high grade iliac stenosis, he subsequently underwent peripheral intervention by Dr. Gwenlyn Found to his right common iliac artery on 06/02/2013. He has felt improved and no longer  experiences claudication. He denies any arrhythmias. He denies palpitations.  Amiodarone was weaned and ultimately discontinued without further arrhythmias.  A followup of duplex scan to his lower extremity in January 2015 suggested an open and patent right common iliac stent without evidence for restenosis.  ABIs were normal bilaterally.  A 1.2.  In January 2015, a followup echo Doppler study showed an ejection fraction of 40-45%.  There was mild mitral regurgitation.  The grade 1 diastolic dysfunction without regional wall motion abnormalities.  When I saw him one year ago, he was sleeping poorly, admitted to fatigue, daytime sleepiness, and had frequent awakenings with loud snoring.  He denied any recurrent anginal symptomatology or claudication or change in exercise tolerance.  Due to concerns for obstructive sleep apnea I scheduled him for sleep study which was done on 05/11/2016.  This was notable for moderate sleep apnea with an HI of 24.6; however, dense were severe with supine sleep with an HI of 33.8, and doing rems sleep with an AHI 41.4.  He had moderate oxygen desaturation to 85%.  Split-night study was done with institution of CPAP.  Initial pressure of 7 cm was recommended.  Unfortunately, without ever coming back to the sleep clinic, he had to give back his machine in October 2017 due to noncompliance.  Presently, he admits that he is not sleeping well.  He is waking up at least 3-4 times per night.  He admits to daytime sleepiness.  He denies recurrent chest pain or palpitations.  He presents for reevaluation.  Past Medical History:  Diagnosis  Date  . Acute MI ([place])    2D ECHO, 03/31/2013 - EF 40-45%. normal  . CAD (coronary artery disease), residual RCA stenosis with recurrent angina 03/31/2013  . Cardiac arrest, secondary to torsodes with 4 shocks by EMS  03/31/2013  . Peripheral vascular disease with claudication ([facility])    90% ostial right common iliac artery stenosis discovered at the  time of cardiac catheterization with an abnormal Doppler study  . S/P angioplasty with stent 03/31/13 to RV ostial marginal with DES Xience Xpedition stent 04/01/2013    Past Surgical History:  Procedure Laterality Date  . CORONARY STENT PLACEMENT  4/27-28/2013   DES to LCx (STEMI culprit); staged PCI to RCA for post STEMI Angina  . FRACTURE SURGERY     both ankles as a child  . LEFT HEART CATHETERIZATION WITH CORONARY ANGIOGRAM N/A 03/29/2013   Procedure: LEFT HEART CATHETERIZATION WITH CORONARY ANGIOGRAM;  Surgeon: Troy Sine, MD;  Location: [facility] CATH LAB;  Service: Cardiovascular;  Laterality: N/A;  . LOWER EXTREMITY ANGIOGRAM N/A 06/02/2013   Procedure: LOWER EXTREMITY ANGIOGRAM;  Surgeon: Lorretta Harp, MD;  Location: [facility] CATH LAB;  Service: Cardiovascular;  Laterality: N/A;  . PERCUTANEOUS CORONARY STENT INTERVENTION (PCI-S) Bilateral 03/31/2013   Procedure: PERCUTANEOUS CORONARY STENT INTERVENTION (PCI-S);  Surgeon: Troy Sine, MD;  Location: [facility] CATH LAB;  Service: Cardiovascular;  Laterality: Bilateral;  . PERCUTANEOUS STENT INTERVENTION Right 06/02/2013   Procedure: PERCUTANEOUS STENT INTERVENTION;  Surgeon: Lorretta Harp, MD;  Location: [facility] CATH LAB;  Service: Cardiovascular;  Laterality: Right;    Allergies  Allergen Reactions  . Lisinopril Rash    rash  . Ticagrelor Rash    Current Outpatient Prescriptions  Medication Sig Dispense Refill  . aspirin EC 81 MG EC tablet Take 1 tablet (81 mg total) by mouth daily.    Marland Kitchen atorvastatin (LIPITOR) 80 MG tablet Take 1 tablet (80 mg total) by mouth daily at 6 PM. 90 tablet 1  . carvedilol (COREG) 12.5 MG tablet Take 1 tablet (12.5 mg total) by mouth 2 (two) times daily with a meal. 30 tablet 0  . clopidogrel (PLAVIX) 75 MG tablet TAKE 1 TABLET DAILY PLEASE MAKE AN APPOINTMENT WITH   YOUR DOCTOR 30 tablet 0  . famotidine (PEPCID) 20 MG tablet TAKE 1 TABLET DAILY 30 tablet 0  . ibuprofen (ADVIL,MOTRIN) 200 MG tablet Take 400 mg by  mouth daily as needed for pain.    . isosorbide mononitrate (IMDUR) 30 MG 24 hr tablet TAKE 1 TABLET DAILY 90 tablet 0  . loratadine (CLARITIN) 10 MG tablet Take 1 tablet (10 mg total) by mouth daily. 30 tablet 0  . Magnesium Oxide 400 (240 Mg) MG TABS TAKE 1 TABLET BY MOUTH TWICE A DAY (NEEDS OFFICE VISIT) 30 tablet 0  . nitroGLYCERIN (NITROSTAT) 0.4 MG SL tablet Place 1 tablet (0.4 mg total) under the tongue every 5 (five) minutes as needed for chest pain. 25 tablet 1  . Magnesium Oxide 400 (240 Mg) MG TABS TAKE 1 TABLET BY MOUTH TWICE A DAY (NEEDS OFFICE VISIT) 60 tablet 0   No current facility-administered medications for this visit.     Socially he is married. One child. He does exercise with walking. There is remote tobacco history having quit in April 2013. He does drink alcohol. He did smoke occasional marijuana.  ROS General: Negative; No fevers, chills, or night sweats; no significant weight loss HEENT: Negative; No changes in vision or hearing, sinus congestion, difficulty swallowing Pulmonary:  Negative; No cough, wheezing, shortness of breath, hemoptysis Cardiovascular: See history of present illness No claudication symptoms GI: Negative; No nausea, vomiting, diarrhea, or abdominal pain GU: Negative; No dysuria, hematuria, or difficulty voiding Musculoskeletal: Negative; no myalgias, joint pain, or weakness Hematologic/Oncology: Negative; no easy bruising, bleeding Endocrine: Negative; no heat/cold intolerance; no diabetes Neuro: Negative; no changes in balance, headaches Skin: Negative; No rashes or skin lesions Psychiatric: Negative; No behavioral problems, depression Sleep: Positive for snoring, daytime sleepiness, hypersomnolence, no bruxism, restless legs, hypnogognic hallucinations, no cataplexy Other comprehensive 14 point system review is negative.   PE BP 109/71   Pulse 75   Ht 5' 10"  (1.778 m)   Wt 219 lb 3.2 oz (99.4 kg)   SpO2 95%   BMI 31.45 kg/m     Repeat blood pressure by me was 128/70  Wt Readings from Last 3 Encounters:  07/09/17 219 lb 3.2 oz (99.4 kg)  05/11/16 215 lb (97.5 kg)  03/17/16 216 lb 3.2 oz (98.1 kg)   General: Alert, oriented, no distress.  Skin: normal turgor, no rashes, warm and dry HEENT: Normocephalic, atraumatic. Pupils equal round and reactive to light; sclera anicteric; extraocular muscles intact; Nose without nasal septal hypertrophy Mouth/Parynx: Full dentures.  Mallinpatti scale 3 Neck: No JVD, no carotid bruits; normal carotid upstroke Lungs: clear to ausculatation and percussion; no wheezing or rales Chest wall: without tenderness to palpitation Heart: PMI not displaced, RRR, s1 s2 normal, 1/6 systolic murmur, no diastolic murmur, no rubs, gallops, thrills, or heaves Abdomen: soft, nontender; no hepatosplenomehaly, BS+; abdominal aorta nontender and not dilated by palpation. Back: no CVA tenderness Pulses 2+ Musculoskeletal: full range of motion, normal strength, no joint deformities Extremities: no clubbing cyanosis or edema, Homan's sign negative  Neurologic: grossly nonfocal; Cranial nerves grossly wnl Psychologic: Normal mood and affect   ECG (independently read by me): Normal sinus rhythm at 61 bpm.  Mild RV conduction delay.  Early transition consistent with probable posterior MI  April 2017 ECG (independently read by me): Sinus bradycardia at 53 bpm with mild sinus arrhythmia.  Early transition in V2.  Nonspecific T changes.  February 2016 ECG (independently read by me): Normal sinus rhythm at 62 bpm.  In fear Q wave in 3 nondiagnostic and aVF. Early transition  June 2015 ECG: Sinus bradycardia 52 beats per minute.  Early transition, compatible with his posterior wall MI.  One isolated PVC.  QTc interval 411 milliseconds.  PR interval 152 msec  Prior October 2014 ECG: Normal sinus rhythm at 60 beats per minute. Prominent R wave in V2 suggesting posterior wall involvement. No significant  ST changes.  LABS:  BMP Latest Ref Rng & Units 01/26/2015 05/13/2014 06/03/2013  Glucose 70 - 99 mg/dL 91 85 113(H)  BUN 6 - 23 mg/dL 10 10 11   Creatinine 0.50 - 1.35 mg/dL 0.99 1.04 1.33  Sodium 135 - 145 mEq/L 138 140 137  Potassium 3.5 - 5.3 mEq/L 4.3 4.3 4.4  Chloride 96 - 112 mEq/L 104 106 104  CO2 19 - 32 mEq/L 23 27 23   Calcium 8.4 - 10.5 mg/dL 9.3 9.7 8.9     Hepatic Function Latest Ref Rng & Units 03/17/2016 01/26/2015 05/13/2014  Total Protein 6.1 - 8.1 g/dL 6.8 6.9 6.8  Albumin 3.6 - 5.1 g/dL 4.6 4.3 4.5  AST 10 - 40 U/L 25 21 21   ALT 9 - 46 U/L 33 27 22  Alk Phosphatase 40 - 115 U/L 63 56 50  Total Bilirubin 0.2 -  1.2 mg/dL 0.9 0.8 0.9  Bilirubin, Direct <=0.2 mg/dL 0.2 - -     CBC Latest Ref Rng & Units 03/17/2016 01/26/2015 05/13/2014  WBC 3.8 - 10.8 K/uL 7.1 6.2 6.9  Hemoglobin 13.2 - 17.1 g/dL 15.9 15.4 14.9  Hematocrit 38.5 - 50.0 % 45.4 42.9 43.5  Platelets 140 - 400 K/uL 148 162 144(L)   Lab Results  Component Value Date   MCV 88.0 03/17/2016   MCV 85.6 01/26/2015   MCV 89.3 05/13/2014    Lab Results  Component Value Date   TSH 2.00 03/17/2016   Lab Results  Component Value Date   HGBA1C 5.4 04/01/2013   Lipid Panel     Component Value Date/Time   CHOL 139 03/17/2016 1004   TRIG 124 03/17/2016 1004   HDL 45 03/17/2016 1004   CHOLHDL 3.1 03/17/2016 1004   VLDL 25 03/17/2016 1004   LDLCALC 69 03/17/2016 1004     RADIOLOGY: No results found.  IMPRESSION:  1. OSA (obstructive sleep apnea)   2. Coronary artery disease involving native coronary artery of native heart without angina pectoris   3. S/P coronary artery stent placement, LCX, emergently 03/29/13   4. Mild obesity   5. Hyperlipidemia with target LDL less than 70     ASSESSMENT AND PLAN: Mr. Deskin is a 51 year old African American gentleman who suffered an ST segment elevation myocardial infarction on 03/29/2013 secondary to circumflex occlusion acutely with VT/VF requiring  successful defibrillation and PTCA/stenting of his circumflex vessel with subsequent staged intervention to his right coronary artery several days later on 03/31/2013. He has not had any arrhythmia since that time. He also was found to have high-grade right common iliac stenosis and is status post successful percutaneous coronary intervention for his peripheral vascular disease. Prior to that procedure, a nuclear study did not demonstrate any ischemia but did show inferior to inferolateral scar c/w the circumflex infarction. Of note, ejection fraction on nuclear study was 32%.  A followup echo Doppler study showed an ejection fraction of 40-45% with grade 1 diastolic dysfunction and without definitive wall motion abnormality.  A follow-up duplex scan of his lower extremity continue to suggest patency of his right common iliac stent.  He continues to feel well without recurrent anginal symptomatology.  His blood pressure today is well controlled on carvedilol 12.5 mg twice a day, in addition to isosorbide 30 mg daily.  He continues to be on aspirin Plavix dual antiplatelet therapy.  I reviewed his sleep study with him in detail today.  His CPAP set up date was 06/26/2016.  Unfortunately, he returned his machine in October 2017 prior to ever being evaluated in the sleep clinic following CPAP initiation.  His initial download from September 16 through 09/17/2016 showed reduced compliance with only 57% of usage stays at only 20% of usage greater than 4 hours.  He was only using CPAP for 3 hours and 11 minutes.  His AHI was elevated and our plan was to increase his CPAP pressure, but unfortunately on 09/18/2016 his machine was picked up due to noncompliance.  Unfortunately, per insurance regulations, he will need to go through another evaluation if he wants CPAP reinitiation.  Unfortunately, I will wish I could just set him up for CPAP auto at home for reinitiation of therapy, but this will not be allowed per insurance  requirements.  We will schedule him for a new evaluation and I plan to see him back in follow-up in 4-5 months for reevaluation.  I discussed weight loss and the importance of exercise as a potential attenuation of his sleep apnea.  Time spent: 25 minutes Troy Sine, MD, [facility]  07/17/2017 10:24 PM

## 2017-07-09 NOTE — Patient Instructions (Signed)
Testing/Procedures: Your physician has recommended that you have a sleep study. This test records several body functions during sleep, including: brain activity, eye movement, oxygen and carbon dioxide blood levels, heart rate and rhythm, breathing rate and rhythm, the flow of air through your mouth and nose, snoring, body muscle movements, and chest and belly movement.  Follow-Up: Your physician recommends that you schedule a follow-up appointment in: 4 MONTHS with Dr. Claiborne Billings (sleep clinic)   Any Other Special Instructions Will Be Listed Below (If Applicable).     If you need a refill on your cardiac medications before your next appointment, please call your pharmacy.

## 2017-07-16 ENCOUNTER — Other Ambulatory Visit: Payer: Self-pay | Admitting: Cardiovascular Disease

## 2017-07-24 ENCOUNTER — Other Ambulatory Visit: Payer: Self-pay | Admitting: Cardiovascular Disease

## 2017-07-24 MED ORDER — CLOPIDOGREL BISULFATE 75 MG PO TABS: 75.0000 mg | ORAL_TABLET | Freq: Every day | ORAL | 0 refills | Status: DC

## 2017-07-24 MED ORDER — CARVEDILOL 12.5 MG PO TABS: 12.5000 mg | ORAL_TABLET | Freq: Two times a day (BID) | ORAL | 0 refills | Status: DC

## 2017-07-24 MED ORDER — ISOSORBIDE MONONITRATE ER 30 MG PO TB24: 30.0000 mg | ORAL_TABLET | Freq: Every day | ORAL | 0 refills | Status: DC

## 2017-07-24 MED ORDER — FAMOTIDINE 20 MG PO TABS: 20.0000 mg | ORAL_TABLET | Freq: Every day | ORAL | 0 refills | Status: DC

## 2017-07-24 MED ORDER — ATORVASTATIN CALCIUM 80 MG PO TABS: 80.0000 mg | ORAL_TABLET | Freq: Every day | ORAL | 0 refills | Status: DC

## 2017-07-24 NOTE — Telephone Encounter (Signed)
Rx(s) sent to pharmacy electronically.  

## 2017-08-10 ENCOUNTER — Telehealth: Payer: Self-pay | Admitting: Cardiovascular Disease

## 2017-08-10 NOTE — Telephone Encounter (Signed)
Restart CPAP treatment auth obtained [id number] exp 11-05-17. Spoke w/Judy Olevia Perches, [phone].  I explained pt had sleep study June 2017.  Machine was turned in Oct 2017 due to not meeting compliance requirements.  Bethena Roys stated pt doesn't need new sleep study.  Pt can have machine now but must meet compliance requirements.  Requirements are that pt use machine for a minimum of 4 (four) hours every day for at least 21 days out of 30 every month within the next 90 days to meet the minimal requirement of 70%.  Pt needs machine now as 90 days started on 08-08-17.

## 2017-08-10 NOTE — Telephone Encounter (Signed)
CPAP orders refaxed to [facility] with request to get patient set up ASAP due to insurance requirements.  (see previous note)

## 2017-08-14 ENCOUNTER — Other Ambulatory Visit: Payer: Self-pay | Admitting: Cardiovascular Disease

## 2017-08-23 ENCOUNTER — Telehealth: Payer: Self-pay | Admitting: *Deleted

## 2017-08-23 NOTE — Telephone Encounter (Signed)
Called patient and left message making aware that sleep study has been cancelled for 9/21, see prior note.  New sleep study not needed.    Advised to call with questions or concerns.

## 2017-08-24 ENCOUNTER — Encounter (HOSPITAL_BASED_OUTPATIENT_CLINIC_OR_DEPARTMENT_OTHER): Payer: BLUE CROSS/BLUE SHIELD

## 2017-10-12 ENCOUNTER — Other Ambulatory Visit: Payer: Self-pay | Admitting: Cardiovascular Disease

## 2017-11-22 ENCOUNTER — Ambulatory Visit: Payer: BLUE CROSS/BLUE SHIELD | Admitting: Cardiovascular Disease

## 2017-11-22 ENCOUNTER — Encounter: Payer: Self-pay | Admitting: Cardiovascular Disease

## 2017-11-22 ENCOUNTER — Other Ambulatory Visit: Payer: Self-pay | Admitting: Cardiovascular Disease

## 2017-11-22 VITALS — BP 124/80 | HR 60 | Ht 71.0 in | Wt 227.0 lb

## 2017-11-22 DIAGNOSIS — G4733 Obstructive sleep apnea (adult) (pediatric): Secondary | ICD-10-CM | POA: Diagnosis not present

## 2017-11-22 DIAGNOSIS — E669 Obesity, unspecified: Secondary | ICD-10-CM | POA: Diagnosis not present

## 2017-11-22 DIAGNOSIS — E785 Hyperlipidemia, unspecified: Secondary | ICD-10-CM | POA: Diagnosis not present

## 2017-11-22 DIAGNOSIS — I2510 Atherosclerotic heart disease of native coronary artery without angina pectoris: Secondary | ICD-10-CM | POA: Diagnosis not present

## 2017-11-22 NOTE — Progress Notes (Signed)
Patient ID: Ricky Roberts, male   DOB: 1966/07/04, 51 y.o.   MRN: [id number]     HPI: Ricky Roberts is a 51 y.o. male who presents to the office for an 4 month cardiology evaluation.   [name] suffered an an acute coronary syndrome/ST segment elevation myocardial infarction inferolaterally in the setting of a cardiac arrest secondary to torsade du pointes requiring defibrillation on 03/29/2013.  He was taken emergently to the cardiac catheterization laboratory by me and found  to have a totally occluded proximal circumflex and 85-90% stenosis in his mid RCA. He underwent successful intervention to his circumflex vessel after undergoing thrombectomy which removed significant thrombus. A Xience Xpedition 3.0x15 mm DES stent was inserted which was postdilated 3.3 mm. He was also noted to have a 90% stenosis in his distal circumflex which underwent PTCA and was reduced to less than 30%. There also is mild disease in the OM1 vessel of 50 and 30%.  He left the laboratory with TIMI 3 flow. 2 days later he underwent staged PCI to his RCA.   His circumflex stent was found to be widely patent. He underwent  angiosculpt cutting balloon, and ultimate stenting of the RCA with a 3.25x18 Xience DES stent postdilated to 3.5 mm the 85-90% stenosis reduced to percent. He also underwent PTCA of the acute marginal branch through the stent strut which improved from 95% to less than 40%. He developed a rash which was felt possibly due to Brilinta and had intolerance to lisinopril.  He has been treated with Effient and low-dose aspirin since his cardiac event.   A nuclear perfusion study on 05/09/2013 showed reduced LV function with severe hypokinesis to akinesis in the inferior temporal lateral collateral region consistent with his prior infarction. Due to high grade iliac stenosis, he subsequently underwent peripheral intervention by Dr. Gwenlyn Found to his right common iliac artery on 06/02/2013. He has felt improved and no longer  experiences claudication. He denies any arrhythmias. He denies palpitations.  Amiodarone was weaned and ultimately discontinued without further arrhythmias.  A followup of duplex scan to his lower extremity in January 2015 suggested an open and patent right common iliac stent without evidence for restenosis.  ABIs were normal bilaterally.  A 1.2.  In January 2015, a followup echo Doppler study showed an ejection fraction of 40-45%.  There was mild mitral regurgitation.  The grade 1 diastolic dysfunction without regional wall motion abnormalities.  When I saw him one year ago, he was sleeping poorly, admitted to fatigue, daytime sleepiness, and had frequent awakenings with loud snoring.  He denied any recurrent anginal symptomatology or claudication or change in exercise tolerance.  Due to concerns for obstructive sleep apnea I scheduled him for sleep study which was done on 05/11/2016.  This was notable for moderate sleep apnea with an HI of 24.6; however, dense were severe with supine sleep with an HI of 33.8, and doing rems sleep with an AHI 41.4.  He had moderate oxygen desaturation to 85%.  Split-night study was done with institution of CPAP.  Initial pressure of 7 cm was recommended.  Unfortunately, without ever coming back to the sleep clinic, he had to give back his machine in October 2017 due to noncompliance.  When I last saw him in August 2018.  He was not sleeping well.  He was waking up at least 3-4 times per night and had significant daytime sleepiness.  He denied any recurrent chest pain or palpitations.    He had a  reset up of CPAP therapy on June 04, 2017.  He has an air since 10 AutoSet unit and was set at 7 cm water pressure.  A download was obtained in the office today from 10/22/2017 through 11/20/2017.  Usage stays was 77%.  However, usage greater than 4 hours was only 37%.  He was only averaging 3 hours and 33 minutes of CPAP use.  At a 7 cm water pressure.  AHI was 8.5.  His time to  meet compliance is 11/27/2017.  An Epworth sleepiness scale score was recalculated in the office today and this was elevated and endorsed at 11, consistent with daytime sleepiness.  He presents for evaluation.  Past Medical History:  Diagnosis Date  . Acute MI ([place])    2D ECHO, 03/31/2013 - EF 40-45%. normal  . CAD (coronary artery disease), residual RCA stenosis with recurrent angina 03/31/2013  . Cardiac arrest, secondary to torsodes with 4 shocks by EMS  03/31/2013  . Peripheral vascular disease with claudication ([facility])    90% ostial right common iliac artery stenosis discovered at the time of cardiac catheterization with an abnormal Doppler study  . S/P angioplasty with stent 03/31/13 to RV ostial marginal with DES Xience Xpedition stent 04/01/2013    Past Surgical History:  Procedure Laterality Date  . CORONARY STENT PLACEMENT  4/27-28/2013   DES to LCx (STEMI culprit); staged PCI to RCA for post STEMI Angina  . FRACTURE SURGERY     both ankles as a child  . LEFT HEART CATHETERIZATION WITH CORONARY ANGIOGRAM N/A 03/29/2013   Procedure: LEFT HEART CATHETERIZATION WITH CORONARY ANGIOGRAM;  Surgeon: Troy Sine, MD;  Location: [facility] CATH LAB;  Service: Cardiovascular;  Laterality: N/A;  . LOWER EXTREMITY ANGIOGRAM N/A 06/02/2013   Procedure: LOWER EXTREMITY ANGIOGRAM;  Surgeon: Lorretta Harp, MD;  Location: [facility] CATH LAB;  Service: Cardiovascular;  Laterality: N/A;  . PERCUTANEOUS CORONARY STENT INTERVENTION (PCI-S) Bilateral 03/31/2013   Procedure: PERCUTANEOUS CORONARY STENT INTERVENTION (PCI-S);  Surgeon: Troy Sine, MD;  Location: [facility] CATH LAB;  Service: Cardiovascular;  Laterality: Bilateral;  . PERCUTANEOUS STENT INTERVENTION Right 06/02/2013   Procedure: PERCUTANEOUS STENT INTERVENTION;  Surgeon: Lorretta Harp, MD;  Location: [facility] CATH LAB;  Service: Cardiovascular;  Laterality: Right;    Allergies  Allergen Reactions  . Lisinopril Rash    rash  . Ticagrelor Rash    Current  Outpatient Medications  Medication Sig Dispense Refill  . aspirin EC 81 MG EC tablet Take 1 tablet (81 mg total) by mouth daily.    Marland Kitchen atorvastatin (LIPITOR) 80 MG tablet Take 1 tablet (80 mg total) by mouth daily at 6 PM. 90 tablet 0  . carvedilol (COREG) 12.5 MG tablet TAKE 1 TABLET TWICE DAILY  WITH MEALS 180 tablet 0  . clopidogrel (PLAVIX) 75 MG tablet TAKE 1 TABLET DAILY 90 tablet 0  . famotidine (PEPCID) 20 MG tablet TAKE 1 TABLET DAILY 90 tablet 0  . ibuprofen (ADVIL,MOTRIN) 200 MG tablet Take 400 mg by mouth daily as needed for pain.    . isosorbide mononitrate (IMDUR) 30 MG 24 hr tablet TAKE 1 TABLET DAILY 90 tablet 0  . loratadine (CLARITIN) 10 MG tablet Take 1 tablet (10 mg total) by mouth daily. 30 tablet 0  . Magnesium Oxide 400 (240 Mg) MG TABS TAKE 1 TABLET BY MOUTH TWICE A DAY (NEEDS OFFICE VISIT) 30 tablet 0  . nitroGLYCERIN (NITROSTAT) 0.4 MG SL tablet Place 1 tablet (0.4 mg total) under the tongue  every 5 (five) minutes as needed for chest pain. 25 tablet 1  . magnesium oxide (MAG-OX) 400 MG tablet TAKE 1 TABLET BY MOUTH TWICE A DAY NEEDS OFFICE VISIT 60 tablet 2   No current facility-administered medications for this visit.     Socially he is married. One child. He does exercise with walking. There is remote tobacco history having quit in April 2013. He does drink alcohol. He did smoke occasional marijuana.  ROS General: Negative; No fevers, chills, or night sweats; no significant weight loss HEENT: Negative; No changes in vision or hearing, sinus congestion, difficulty swallowing Pulmonary: Negative; No cough, wheezing, shortness of breath, hemoptysis Cardiovascular: See history of present illness No claudication symptoms GI: Negative; No nausea, vomiting, diarrhea, or abdominal pain GU: Negative; No dysuria, hematuria, or difficulty voiding Musculoskeletal: Negative; no myalgias, joint pain, or weakness Hematologic/Oncology: Negative; no easy bruising,  bleeding Endocrine: Negative; no heat/cold intolerance; no diabetes Neuro: Negative; no changes in balance, headaches Skin: Negative; No rashes or skin lesions Psychiatric: Negative; No behavioral problems, depression Sleep: Positive for snoring, daytime sleepiness, hypersomnolence, no bruxism, restless legs, hypnogognic hallucinations, no cataplexy Other comprehensive 14 point system review is negative.   PE BP 124/80   Pulse 60   Ht 5' 11"  (1.803 m)   Wt 227 lb (103 kg)   BMI 31.66 kg/m    Repeat blood pressure was 120/74  Wt Readings from Last 3 Encounters:  11/22/17 227 lb (103 kg)  07/09/17 219 lb 3.2 oz (99.4 kg)  05/11/16 215 lb (97.5 kg)   General: Alert, oriented, no distress.  Skin: normal turgor, no rashes, warm and dry HEENT: Normocephalic, atraumatic. Pupils equal round and reactive to light; sclera anicteric; extraocular muscles intact;  Nose without nasal septal hypertrophy Mouth/Parynx: Full dentures;; Mallinpatti scale 3 Neck: No JVD, no carotid bruits; normal carotid upstroke Lungs: clear to ausculatation and percussion; no wheezing or rales Chest wall: without tenderness to palpitation Heart: PMI not displaced, RRR, s1 s2 normal, 1/6 systolic murmur, no diastolic murmur, no rubs, gallops, thrills, or heaves Abdomen: soft, nontender; no hepatosplenomehaly, BS+; abdominal aorta nontender and not dilated by palpation. Back: no CVA tenderness Pulses 2+ Musculoskeletal: full range of motion, normal strength, no joint deformities Extremities: no clubbing cyanosis or edema, Homan's sign negative  Neurologic: grossly nonfocal; Cranial nerves grossly wnl Psychologic: Normal mood and affect   ECG (independently read by me): Normal sinus rhythm at 61 bpm.  Mild RV conduction delay.  Early transition consistent with probable posterior MI  April 2017 ECG (independently read by me): Sinus bradycardia at 53 bpm with mild sinus arrhythmia.  Early transition in V2.   Nonspecific T changes.  February 2016 ECG (independently read by me): Normal sinus rhythm at 62 bpm.  In fear Q wave in 3 nondiagnostic and aVF. Early transition  June 2015 ECG: Sinus bradycardia 52 beats per minute.  Early transition, compatible with his posterior wall MI.  One isolated PVC.  QTc interval 411 milliseconds.  PR interval 152 msec  Prior October 2014 ECG: Normal sinus rhythm at 60 beats per minute. Prominent R wave in V2 suggesting posterior wall involvement. No significant ST changes.  LABS:  BMP Latest Ref Rng & Units 01/26/2015 05/13/2014 06/03/2013  Glucose 70 - 99 mg/dL 91 85 113(H)  BUN 6 - 23 mg/dL 10 10 11   Creatinine 0.50 - 1.35 mg/dL 0.99 1.04 1.33  Sodium 135 - 145 mEq/L 138 140 137  Potassium 3.5 - 5.3 mEq/L 4.3 4.3  4.4  Chloride 96 - 112 mEq/L 104 106 104  CO2 19 - 32 mEq/L 23 27 23   Calcium 8.4 - 10.5 mg/dL 9.3 9.7 8.9     Hepatic Function Latest Ref Rng & Units 03/17/2016 01/26/2015 05/13/2014  Total Protein 6.1 - 8.1 g/dL 6.8 6.9 6.8  Albumin 3.6 - 5.1 g/dL 4.6 4.3 4.5  AST 10 - 40 U/L 25 21 21   ALT 9 - 46 U/L 33 27 22  Alk Phosphatase 40 - 115 U/L 63 56 50  Total Bilirubin 0.2 - 1.2 mg/dL 0.9 0.8 0.9  Bilirubin, Direct <=0.2 mg/dL 0.2 - -     CBC Latest Ref Rng & Units 03/17/2016 01/26/2015 05/13/2014  WBC 3.8 - 10.8 K/uL 7.1 6.2 6.9  Hemoglobin 13.2 - 17.1 g/dL 15.9 15.4 14.9  Hematocrit 38.5 - 50.0 % 45.4 42.9 43.5  Platelets 140 - 400 K/uL 148 162 144(L)   Lab Results  Component Value Date   MCV 88.0 03/17/2016   MCV 85.6 01/26/2015   MCV 89.3 05/13/2014    Lab Results  Component Value Date   TSH 2.00 03/17/2016   Lab Results  Component Value Date   HGBA1C 5.4 04/01/2013   Lipid Panel     Component Value Date/Time   CHOL 139 03/17/2016 1004   TRIG 124 03/17/2016 1004   HDL 45 03/17/2016 1004   CHOLHDL 3.1 03/17/2016 1004   VLDL 25 03/17/2016 1004   LDLCALC 69 03/17/2016 1004     RADIOLOGY: No results  found.  IMPRESSION:  1. OSA (obstructive sleep apnea)   2. Coronary artery disease involving native coronary artery of native heart without angina pectoris   3. Hyperlipidemia with target LDL less than 70   4. Mild obesity     ASSESSMENT AND PLAN: Mr. Gascoigne is a 51 year old African American gentleman who suffered an ST segment elevation myocardial infarction on 03/29/2013 secondary to circumflex occlusion acutely with VT/VF requiring successful defibrillation and PTCA/stenting of his circumflex vessel with subsequent staged intervention to his right coronary artery several days later on 03/31/2013. He has not had any arrhythmia since that time. He also was found to have high-grade right common iliac stenosis and is status post successful percutaneous coronary intervention for his peripheral vascular disease. Prior to that procedure, a nuclear study did not demonstrate any ischemia and demonstrated inferior to inferolateral scar c/w the circumflex infarction. Of note, ejection fraction on nuclear study was 32%.  A followup echo Doppler study showed an ejection fraction of 40-45% with grade 1 diastolic dysfunction and without definitive wall motion abnormality.  A follow-up duplex scan of his lower extremity continue to suggest patency of his right common iliac stent.  He denies any recurrent anginal symptoms.  His blood pressure today is controlled and he is without recurrent anginal symptomatology.  He continues to be on carvedilol 12.5 twice a day, isosorbide 30 mg daily in addition to aspirin/Plavix antiplatelet therapy.  On his sleep study in June 2017.  He was found to have moderate obstructive sleep apnea with an AHI of 24.6 per hour.  However, his events were severe with supine sleep with an HIF 33.8 per hour and doing rems sleep with an HI 41.4 per hour.  He had oxygen desaturation to 85%.  He was noncompliant initially had to turn his machine in October 2017.  He was reset up in July 2018.  On  his current download.  He is still not compliant particular with reference to usage greater  than 4 hours.  I discussed the situation with Anderson Malta from [employer], which is his DME company.  Apparently his compliance deadline is 11/27/2017.  Since there is no way can be compliant by that date, they will try to arrange so that he can out right purchase the machine for residual minimal cost.  He then will need to obtain without insurance company benefit and probably can obtain these online at reduced cost.  I again stressed the importance of optimal treatment of his sleep apnea with reference to sleep duration and use.  He continues to be on atorvastatin 80 mg for hyperlipidemia.  I will see him in 6 months for reevaluation.  Time spent: 25 minutes Troy Sine, MD, [facility]  11/24/2017 11:17 PM

## 2017-11-22 NOTE — Patient Instructions (Signed)
Your physician recommends that you continue on your current medications as directed. Please refer to the Current Medication list given to you today.  Your physician wants you to follow-up in:   6 MONTHS WITH DR KELLY   You will receive a reminder letter in the mail two months in advance. If you don't receive a letter, please call our office to schedule the follow-up appointment.  

## 2017-11-24 ENCOUNTER — Encounter: Payer: Self-pay | Admitting: Cardiovascular Disease

## 2017-12-30 ENCOUNTER — Other Ambulatory Visit: Payer: Self-pay | Admitting: Cardiovascular Disease

## 2018-01-09 ENCOUNTER — Other Ambulatory Visit: Payer: Self-pay | Admitting: Cardiovascular Disease

## 2018-01-09 DIAGNOSIS — I739 Peripheral vascular disease, unspecified: Secondary | ICD-10-CM

## 2018-01-23 ENCOUNTER — Inpatient Hospital Stay (HOSPITAL_COMMUNITY): Admission: RE | Admit: 2018-01-23 | Payer: BLUE CROSS/BLUE SHIELD | Source: Ambulatory Visit

## 2018-01-24 ENCOUNTER — Ambulatory Visit (HOSPITAL_COMMUNITY)
Admission: RE | Admit: 2018-01-24 | Payer: BLUE CROSS/BLUE SHIELD | Source: Ambulatory Visit | Attending: Cardiovascular Disease | Admitting: Cardiovascular Disease

## 2018-01-24 ENCOUNTER — Ambulatory Visit (HOSPITAL_COMMUNITY): Payer: BLUE CROSS/BLUE SHIELD

## 2018-02-05 ENCOUNTER — Telehealth: Payer: Self-pay | Admitting: Cardiovascular Disease

## 2018-02-05 MED ORDER — ISOSORBIDE MONONITRATE ER 30 MG PO TB24: 30.0000 mg | ORAL_TABLET | Freq: Every day | ORAL | 1 refills | Status: DC

## 2018-02-05 MED ORDER — CARVEDILOL 12.5 MG PO TABS: 12.5000 mg | ORAL_TABLET | Freq: Two times a day (BID) | ORAL | 1 refills | Status: DC

## 2018-02-05 MED ORDER — CLOPIDOGREL BISULFATE 75 MG PO TABS: 75.0000 mg | ORAL_TABLET | Freq: Every day | ORAL | 1 refills | Status: DC

## 2018-02-05 MED ORDER — FAMOTIDINE 20 MG PO TABS: 20.0000 mg | ORAL_TABLET | Freq: Every day | ORAL | 1 refills | Status: DC

## 2018-02-05 NOTE — Telephone Encounter (Signed)
Appeal faxed to [facility], [phone], phone [phone]. for Aorta Duplex Scan CPT 9398.  Prior authorization denied by [employer].

## 2018-02-05 NOTE — Telephone Encounter (Signed)
New message     *STAT* If patient is at the pharmacy, call can be transferred to refill team.   1. Which medications need to be refilled? (please list name of each medication and dose if known) carvedilol (COREG) 12.5 MG tablet, famotidine (PEPCID) 20 MG tablet, isosorbide mononitrate (IMDUR) 30 MG 24 hr tablet and clopidogrel (PLAVIX) 75 MG tablet  2. Which pharmacy/location (including street and city if local pharmacy) is medication to be sent to? [facility] [place] AT Portal to Registered [employer] Sites  3. Do they need a 30 day or 90 day supply? [place]

## 2018-02-27 ENCOUNTER — Ambulatory Visit (HOSPITAL_COMMUNITY): Admission: RE | Admit: 2018-02-27 | Payer: BLUE CROSS/BLUE SHIELD | Source: Ambulatory Visit

## 2018-03-03 ENCOUNTER — Other Ambulatory Visit: Payer: Self-pay | Admitting: Cardiovascular Disease

## 2018-03-04 ENCOUNTER — Ambulatory Visit (HOSPITAL_COMMUNITY)
Admission: RE | Admit: 2018-03-04 | Discharge: 2018-03-04 | Disposition: A | Discharge status: Home / Self Care | Payer: BLUE CROSS/BLUE SHIELD | Source: Ambulatory Visit | Attending: Cardiology | Admitting: Cardiology

## 2018-03-04 ENCOUNTER — Encounter (HOSPITAL_COMMUNITY): Payer: BLUE CROSS/BLUE SHIELD

## 2018-03-04 DIAGNOSIS — I2510 Atherosclerotic heart disease of native coronary artery without angina pectoris: Secondary | ICD-10-CM | POA: Insufficient documentation

## 2018-03-04 DIAGNOSIS — Z87891 Personal history of nicotine dependence: Secondary | ICD-10-CM | POA: Diagnosis not present

## 2018-03-04 DIAGNOSIS — E785 Hyperlipidemia, unspecified: Secondary | ICD-10-CM | POA: Diagnosis not present

## 2018-03-04 DIAGNOSIS — I739 Peripheral vascular disease, unspecified: Secondary | ICD-10-CM | POA: Diagnosis not present

## 2018-03-04 DIAGNOSIS — Z95828 Presence of other vascular implants and grafts: Secondary | ICD-10-CM | POA: Diagnosis not present

## 2018-03-04 NOTE — Telephone Encounter (Signed)
REFILL 

## 2018-03-07 ENCOUNTER — Other Ambulatory Visit: Payer: Self-pay | Admitting: Cardiovascular Disease

## 2018-03-07 DIAGNOSIS — I739 Peripheral vascular disease, unspecified: Secondary | ICD-10-CM

## 2018-03-08 ENCOUNTER — Telehealth: Payer: Self-pay | Admitting: Cardiovascular Disease

## 2018-03-08 NOTE — Telephone Encounter (Signed)
Spoke w/pt.  [employer] Appeal Form authorizing [facility] to file appeal on pt's behalf faxed to [place] [phone] on 03-04-18.  I calledtoday to check status.  Needed form resent w/inquiry #[id number]. Form refaxed.

## 2018-05-28 ENCOUNTER — Encounter: Payer: Self-pay | Admitting: Cardiovascular Disease

## 2018-05-28 ENCOUNTER — Ambulatory Visit: Payer: BLUE CROSS/BLUE SHIELD | Admitting: Cardiovascular Disease

## 2018-05-28 VITALS — BP 124/78 | HR 61 | Ht 70.0 in | Wt 215.4 lb

## 2018-05-28 DIAGNOSIS — I2510 Atherosclerotic heart disease of native coronary artery without angina pectoris: Secondary | ICD-10-CM | POA: Diagnosis not present

## 2018-05-28 DIAGNOSIS — Z79899 Other long term (current) drug therapy: Secondary | ICD-10-CM | POA: Diagnosis not present

## 2018-05-28 DIAGNOSIS — E785 Hyperlipidemia, unspecified: Secondary | ICD-10-CM

## 2018-05-28 DIAGNOSIS — G4733 Obstructive sleep apnea (adult) (pediatric): Secondary | ICD-10-CM | POA: Diagnosis not present

## 2018-05-28 DIAGNOSIS — Z955 Presence of coronary angioplasty implant and graft: Secondary | ICD-10-CM

## 2018-05-28 NOTE — Patient Instructions (Signed)
Medication Instructions:  Your physician recommends that you continue on your current medications as directed. Please refer to the Current Medication list given to you today.  Labwork: Today  (CMET, CBC, Lipid, TSH)  Follow-Up: Your physician wants you to follow-up in: 1 year with Dr. Claiborne Billings.  You will receive a reminder letter in the mail two months in advance. If you don't receive a letter, please call our office to schedule the follow-up appointment.   Any Other Special Instructions Will Be Listed Below (If Applicable).     If you need a refill on your cardiac medications before your next appointment, please call your pharmacy.

## 2018-05-28 NOTE — Progress Notes (Signed)
Patient ID: Ricky Roberts, male   DOB: [birth year], 52 y.o.   MRN: [id number]     HPI: Ricky Roberts is a 52 y.o. male who presents to the office for a 7 month cardiology evaluation.   Ricky Roberts suffered an an acute coronary syndrome/ST segment elevation myocardial infarction inferolaterally in the setting of a cardiac arrest secondary to torsade du pointes requiring defibrillation on 03/29/2013.  He was taken emergently to the cardiac catheterization laboratory by me and found  to have a totally occluded proximal circumflex and 85-90% stenosis in his mid RCA. He underwent successful intervention to his circumflex vessel after undergoing thrombectomy which removed significant thrombus. A Xience Xpedition 3.0x15 mm DES stent was inserted which was postdilated 3.3 mm. He was also noted to have a 90% stenosis in his distal circumflex which underwent PTCA and was reduced to less than 30%. There also is mild disease in the OM1 vessel of 50 and 30%.  He left the laboratory with TIMI 3 flow. 2 days later he underwent staged PCI to his RCA.   His circumflex stent was found to be widely patent. He underwent  angiosculpt cutting balloon, and ultimate stenting of the RCA with a 3.25x18 Xience DES stent postdilated to 3.5 mm the 85-90% stenosis reduced to percent. He also underwent PTCA of the acute marginal branch through the stent strut which improved from 95% to less than 40%. He developed a rash which was felt possibly due to Brilinta and had intolerance to lisinopril.  He has been treated with Effient and low-dose aspirin since his cardiac event.   A nuclear perfusion study on 05/09/2013 showed reduced LV function with severe hypokinesis to akinesis in the inferior temporal lateral collateral region consistent with his prior infarction. Due to high grade iliac stenosis, he subsequently underwent peripheral intervention by Dr. Gwenlyn Found to his right common iliac artery on 06/02/2013. He has felt improved and no longer  experiences claudication. He denies any arrhythmias. He denies palpitations.  Amiodarone was weaned and ultimately discontinued without further arrhythmias.  A followup of duplex scan to his lower extremity in January 2015 suggested an open and patent right common iliac stent without evidence for restenosis.  ABIs were normal bilaterally.  A 1.2.  In January 2015, a followup echo Doppler study showed an ejection fraction of 40-45%.  There was mild mitral regurgitation.  The grade 1 diastolic dysfunction without regional wall motion abnormalities.  When I saw him one year ago, he was sleeping poorly, admitted to fatigue, daytime sleepiness, and had frequent awakenings with loud snoring.  He denied any recurrent anginal symptomatology or claudication or change in exercise tolerance.  Due to concerns for obstructive sleep apnea I scheduled him for sleep study which was done on 05/11/2016.  This was notable for moderate sleep apnea with an HI of 24.6; however, dense were severe with supine sleep with an HI of 33.8, and doing rems sleep with an AHI 41.4.  He had moderate oxygen desaturation to 85%.  Split-night study was done with institution of CPAP.  Initial pressure of 7 cm was recommended.  Unfortunately, without ever coming back to the sleep clinic, he had to give back his machine in October 2017 due to noncompliance.  When I last saw him in August 2018.  He was not sleeping well.  He was waking up at least 3-4 times per night and had significant daytime sleepiness.  He denied any recurrent chest pain or palpitations.    He had a  reset up of CPAP therapy on June 04, 2017 with a ResMed10 AutoSet unit and was set at 7 cm water pressure.  A download was obtained  from 10/22/2017 through 11/20/2017.  Usage stays was 77%.  However, usage greater than 4 hours was only 37%.  He was only averaging 3 hours and 33 minutes of CPAP use.  At a 7 cm water pressure.  AHI was 8.5.  His time to meet compliance is 11/27/2017.   An Epworth sleepiness scale score was recalculated in the office today and this was elevated and endorsed at 11, consistent with daytime sleepiness.    I last saw him in December 2018.  Fortunately due to not meeting compliance standards, he had to turn the machine although there was discussion about trying to obtain the machine for minimal cost.  He tells me he is scheduled for reinitiation of CPAP therapy with the [facility].  He will be's seen in [place] in August.  Only, he denies any chest pain.  He denies palpitations.  He continues to be on a baby aspirin and Plavix 75 mg.  He is on carvedilol 12.5 mg twice a day, isosorbide 30 mg daily.  He continues to be on atorvastatin 80 mg with target LDL less than 70.  He presents for reevaluation.  Past Medical History:  Diagnosis Date  . Acute MI ([place])    2D ECHO, 03/31/2013 - EF 40-45%. normal  . CAD (coronary artery disease), residual RCA stenosis with recurrent angina 03/31/2013  . Cardiac arrest, secondary to torsodes with 4 shocks by EMS  03/31/2013  . Peripheral vascular disease with claudication ([facility])    90% ostial right common iliac artery stenosis discovered at the time of cardiac catheterization with an abnormal Doppler study  . S/P angioplasty with stent 03/31/13 to RV ostial marginal with DES Xience Xpedition stent 04/01/2013    Past Surgical History:  Procedure Laterality Date  . CORONARY STENT PLACEMENT  4/27-28/2013   DES to LCx (STEMI culprit); staged PCI to RCA for post STEMI Angina  . FRACTURE SURGERY     both ankles as a child  . LEFT HEART CATHETERIZATION WITH CORONARY ANGIOGRAM N/A 03/29/2013   Procedure: LEFT HEART CATHETERIZATION WITH CORONARY ANGIOGRAM;  Surgeon: Troy Sine, MD;  Location: [facility] CATH LAB;  Service: Cardiovascular;  Laterality: N/A;  . LOWER EXTREMITY ANGIOGRAM N/A 06/02/2013   Procedure: LOWER EXTREMITY ANGIOGRAM;  Surgeon: Lorretta Harp, MD;  Location: [facility] CATH LAB;  Service: Cardiovascular;   Laterality: N/A;  . PERCUTANEOUS CORONARY STENT INTERVENTION (PCI-S) Bilateral 03/31/2013   Procedure: PERCUTANEOUS CORONARY STENT INTERVENTION (PCI-S);  Surgeon: Troy Sine, MD;  Location: [facility] CATH LAB;  Service: Cardiovascular;  Laterality: Bilateral;  . PERCUTANEOUS STENT INTERVENTION Right 06/02/2013   Procedure: PERCUTANEOUS STENT INTERVENTION;  Surgeon: Lorretta Harp, MD;  Location: [facility] CATH LAB;  Service: Cardiovascular;  Laterality: Right;    Allergies  Allergen Reactions  . Lisinopril Rash    rash  . Ticagrelor Rash    Current Outpatient Medications  Medication Sig Dispense Refill  . aspirin EC 81 MG tablet Take 81 mg by mouth daily.    Marland Kitchen atorvastatin (LIPITOR) 80 MG tablet Take 1 tablet (80 mg total) by mouth daily at 6 PM. 90 tablet 0  . carvedilol (COREG) 12.5 MG tablet Take 1 tablet (12.5 mg total) by mouth 2 (two) times daily with a meal. 180 tablet 1  . clopidogrel (PLAVIX) 75 MG tablet Take 1 tablet (75  mg total) by mouth daily. 90 tablet 1  . famotidine (PEPCID) 20 MG tablet Take 1 tablet (20 mg total) by mouth daily. 90 tablet 1  . ibuprofen (ADVIL,MOTRIN) 200 MG tablet Take 400 mg by mouth daily as needed for pain.    . isosorbide mononitrate (IMDUR) 30 MG 24 hr tablet Take 1 tablet (30 mg total) by mouth daily. 90 tablet 1  . loratadine (CLARITIN) 10 MG tablet Take 1 tablet (10 mg total) by mouth daily. 30 tablet 0  . magnesium oxide (MAG-OX) 400 MG tablet TAKE 1 TABLET BY MOUTH TWICE A DAY NEEDS OFFICE VISIT 60 tablet 2  . nitroGLYCERIN (NITROSTAT) 0.4 MG SL tablet Place 1 tablet (0.4 mg total) under the tongue every 5 (five) minutes as needed for chest pain. 25 tablet 1   No current facility-administered medications for this visit.     Socially he is married. One child. He does exercise with walking. There is remote tobacco history having quit in April 2013. He does drink alcohol. He did smoke occasional marijuana.  ROS General: Negative; No fevers, chills,  or night sweats; no significant weight loss HEENT: Negative; No changes in vision or hearing, sinus congestion, difficulty swallowing Pulmonary: Negative; No cough, wheezing, shortness of breath, hemoptysis Cardiovascular: See history of present illness No claudication symptoms GI: Negative; No nausea, vomiting, diarrhea, or abdominal pain GU: Negative; No dysuria, hematuria, or difficulty voiding Musculoskeletal: Negative; no myalgias, joint pain, or weakness Hematologic/Oncology: Negative; no easy bruising, bleeding Endocrine: Negative; no heat/cold intolerance; no diabetes Neuro: Negative; no changes in balance, headaches Skin: Negative; No rashes or skin lesions Psychiatric: Negative; No behavioral problems, depression Sleep: Positive for snoring, daytime sleepiness, hypersomnolence, no bruxism, restless legs, hypnogognic hallucinations, no cataplexy Other comprehensive 14 point system review is negative.   PE BP 124/78   Pulse 61   Ht 5' 10"  (1.778 m)   Wt 215 lb 6.4 oz (97.7 kg)   BMI 30.91 kg/m    Repeat blood pressure by me was 122/80  Wt Readings from Last 3 Encounters:  05/28/18 215 lb 6.4 oz (97.7 kg)  11/22/17 227 lb (103 kg)  07/09/17 219 lb 3.2 oz (99.4 kg)   General: Alert, oriented, no distress.  Skin: normal turgor, no rashes, warm and dry HEENT: Normocephalic, atraumatic. Pupils equal round and reactive to light; sclera anicteric; extraocular muscles intact;  Nose without nasal septal hypertrophy Mouth/Parynx benign; Mallinpatti scale 3 Neck: No JVD, no carotid bruits; normal carotid upstroke Lungs: clear to ausculatation and percussion; no wheezing or rales Chest wall: without tenderness to palpitation Heart: PMI not displaced, RRR, s1 s2 normal, 1/6 systolic murmur, no diastolic murmur, no rubs, gallops, thrills, or heaves Abdomen: soft, nontender; no hepatosplenomehaly, BS+; abdominal aorta nontender and not dilated by palpation. Back: no CVA  tenderness Pulses 2+ Musculoskeletal: full range of motion, normal strength, no joint deformities Extremities: no clubbing cyanosis or edema, Homan's sign negative  Neurologic: grossly nonfocal; Cranial nerves grossly wnl Psychologic: Normal mood and affect   ECG (independently read by me): Normal sinus rhythm at 61 bpm.  Early transition.  Early repolarization changes.  December 2018 ECG (independently read by me): Normal sinus rhythm at 61 bpm.  Mild RV conduction delay.  Early transition consistent with probable posterior MI  April 2017 ECG (independently read by me): Sinus bradycardia at 53 bpm with mild sinus arrhythmia.  Early transition in V2.  Nonspecific T changes.  February 2016 ECG (independently read by me): Normal sinus rhythm  at 62 bpm.  In fear Q wave in 3 nondiagnostic and aVF. Early transition  June 2015 ECG: Sinus bradycardia 52 beats per minute.  Early transition, compatible with his posterior wall MI.  One isolated PVC.  QTc interval 411 milliseconds.  PR interval 152 msec  Prior October 2014 ECG: Normal sinus rhythm at 60 beats per minute. Prominent R wave in V2 suggesting posterior wall involvement. No significant ST changes.  LABS:  BMP Latest Ref Rng & Units 05/28/2018 01/26/2015 05/13/2014  Glucose 65 - 99 mg/dL 94 91 85  BUN 6 - 24 mg/dL 8 10 10   Creatinine 0.76 - 1.27 mg/dL 0.95 0.99 1.04  BUN/Creat Ratio 9 - 20 8(L) - -  Sodium 134 - 144 mmol/L 142 138 140  Potassium 3.5 - 5.2 mmol/L 4.3 4.3 4.3  Chloride 96 - 106 mmol/L 107(H) 104 106  CO2 20 - 29 mmol/L 23 23 27   Calcium 8.7 - 10.2 mg/dL 9.0 9.3 9.7     Hepatic Function Latest Ref Rng & Units 05/28/2018 03/17/2016 01/26/2015  Total Protein 6.0 - 8.5 g/dL 6.5 6.8 6.9  Albumin 3.5 - 5.5 g/dL 4.3 4.6 4.3  AST 0 - 40 IU/L 20 25 21   ALT 0 - 44 IU/L 24 33 27  Alk Phosphatase 39 - 117 IU/L 61 63 56  Total Bilirubin 0.0 - 1.2 mg/dL 0.6 0.9 0.8  Bilirubin, Direct <=0.2 mg/dL - 0.2 -     CBC Latest Ref Rng  & Units 05/28/2018 03/17/2016 01/26/2015  WBC 3.4 - 10.8 x10E3/uL 6.0 7.1 6.2  Hemoglobin 13.0 - 17.7 g/dL 14.3 15.9 15.4  Hematocrit 37.5 - 51.0 % 41.7 45.4 42.9  Platelets 150 - 450 x10E3/uL 147(L) 148 162   Lab Results  Component Value Date   MCV 87 05/28/2018   MCV 88.0 03/17/2016   MCV 85.6 01/26/2015    Lab Results  Component Value Date   TSH 1.270 05/28/2018   Lab Results  Component Value Date   HGBA1C 5.4 04/01/2013   Lipid Panel     Component Value Date/Time   CHOL 147 05/28/2018 1459   TRIG 102 05/28/2018 1459   HDL 41 05/28/2018 1459   CHOLHDL 3.6 05/28/2018 1459   CHOLHDL 3.1 03/17/2016 1004   VLDL 25 03/17/2016 1004   LDLCALC 86 05/28/2018 1459     RADIOLOGY: No results found.  IMPRESSION:  1. Coronary artery disease involving native coronary artery of native heart without angina pectoris   2. Hyperlipidemia with target LDL less than 70   3. S/P coronary artery stent placement, LCX, emergently 03/29/13 and staged to RCA   4. OSA (obstructive sleep apnea)   5. Medication management     ASSESSMENT AND PLAN: Ricky Roberts is a 52 year old African American gentleman who suffered an ST segment elevation myocardial infarction on 03/29/2013 secondary to circumflex occlusion acutely with VT/VF requiring successful defibrillation and PTCA/stenting of his circumflex vessel with subsequent staged intervention to his right coronary artery several days later on 03/31/2013. He has not had any arrhythmia since that time. He also was found to have high-grade right common iliac stenosis and is status post successful percutaneous coronary intervention for his peripheral vascular disease. Prior to that procedure, a nuclear study did not demonstrate any ischemia and demonstrated inferior to inferolateral scar c/w the circumflex infarction. Of note, ejection fraction on nuclear study was 32%.  A followup echo Doppler study showed an ejection fraction of 40-45% with grade 1 diastolic  dysfunction and without  definitive wall motion abnormality.  A follow-up duplex scan of his lower extremity continues to suggest patency of his right common iliac stent.  He denies any recurrent anginal symptoms.  His blood pressure today is controlled and he is without recurrent anginal symptomatology.  Recently, his blood pressure is well controlled.  He is not having any recurrent anginal symptomatology.  He is on isosorbide 30 mg in addition to carvedilol 12.5 mg twice a day.  He continues to be on aspirin and Plavix for dual antiplatelet therapy.  There is no bleeding.  He is on atorvastatin 80 mg for hyperlipidemia with target LDL less than 70.  He has obstructive sleep apnea and tells me he will be reinitiating CPAP therapy which will be covered by the [facility].  Had moderate obstructive sleep apnea overall with an AHI of 24.6/h; however, events were severe supine sleep and during REM with an AHI of 33.8/h and 41.4/h, respectively.  I am rechecking fasting lipids to make certain he is still at target on atorvastatin.  Since he will be followed fairly closely by the [facility], I will see him in 1 year for reevaluation.   Spent: 25 minutes Troy Sine, MD, [facility]  05/30/2018 2:19 PM

## 2018-05-29 LAB — CBC
HEMATOCRIT: 41.7 % (ref 37.5–51.0)
HEMOGLOBIN: 14.3 g/dL (ref 13.0–17.7)
MCH: 30 pg (ref 26.6–33.0)
MCHC: 34.3 g/dL (ref 31.5–35.7)
MCV: 87 fL (ref 79–97)
Platelets: 147 10*3/uL — ABNORMAL LOW (ref 150–450)
RBC: 4.77 x10E6/uL (ref 4.14–5.80)
RDW: 14 % (ref 12.3–15.4)
WBC: 6 10*3/uL (ref 3.4–10.8)

## 2018-05-29 LAB — COMPREHENSIVE METABOLIC PANEL
ALBUMIN: 4.3 g/dL (ref 3.5–5.5)
ALT: 24 IU/L (ref 0–44)
AST: 20 IU/L (ref 0–40)
Albumin/Globulin Ratio: 2 (ref 1.2–2.2)
Alkaline Phosphatase: 61 IU/L (ref 39–117)
BUN / CREAT RATIO: 8 — AB (ref 9–20)
BUN: 8 mg/dL (ref 6–24)
Bilirubin Total: 0.6 mg/dL (ref 0.0–1.2)
CHLORIDE: 107 mmol/L — AB (ref 96–106)
CO2: 23 mmol/L (ref 20–29)
CREATININE: 0.95 mg/dL (ref 0.76–1.27)
Calcium: 9 mg/dL (ref 8.7–10.2)
GFR calc non Af Amer: 93 mL/min/{1.73_m2} (ref >59)
GFR, EST AFRICAN AMERICAN: 107 mL/min/{1.73_m2} (ref >59)
GLUCOSE: 94 mg/dL (ref 65–99)
Globulin, Total: 2.2 g/dL (ref 1.5–4.5)
Potassium: 4.3 mmol/L (ref 3.5–5.2)
Sodium: 142 mmol/L (ref 134–144)
TOTAL PROTEIN: 6.5 g/dL (ref 6.0–8.5)

## 2018-05-29 LAB — LIPID PANEL
CHOL/HDL RATIO: 3.6 ratio (ref 0.0–5.0)
CHOLESTEROL TOTAL: 147 mg/dL (ref 100–199)
HDL: 41 mg/dL (ref >39)
LDL CALC: 86 mg/dL (ref 0–99)
TRIGLYCERIDES: 102 mg/dL (ref 0–149)
VLDL CHOLESTEROL CAL: 20 mg/dL (ref 5–40)

## 2018-05-29 LAB — TSH: TSH: 1.27 u[IU]/mL (ref 0.450–4.500)

## 2018-05-30 ENCOUNTER — Encounter: Payer: Self-pay | Admitting: Cardiovascular Disease

## 2018-06-04 ENCOUNTER — Other Ambulatory Visit: Payer: Self-pay | Admitting: *Deleted

## 2018-06-04 DIAGNOSIS — Z79899 Other long term (current) drug therapy: Secondary | ICD-10-CM

## 2018-06-04 DIAGNOSIS — E785 Hyperlipidemia, unspecified: Secondary | ICD-10-CM

## 2018-06-04 MED ORDER — EZETIMIBE 10 MG PO TABS: 10.0000 mg | ORAL_TABLET | Freq: Every day | ORAL | 3 refills | Status: DC

## 2018-06-14 ENCOUNTER — Other Ambulatory Visit: Payer: Self-pay | Admitting: Cardiovascular Disease

## 2018-08-13 ENCOUNTER — Other Ambulatory Visit: Payer: Self-pay | Admitting: Cardiovascular Disease

## 2018-09-15 ENCOUNTER — Other Ambulatory Visit: Payer: Self-pay | Admitting: Cardiovascular Disease

## 2018-10-02 LAB — COMPREHENSIVE METABOLIC PANEL
A/G RATIO: 1.9 (ref 1.2–2.2)
ALT: 24 IU/L (ref 0–44)
AST: 26 IU/L (ref 0–40)
Albumin: 4.3 g/dL (ref 3.5–5.5)
Alkaline Phosphatase: 55 IU/L (ref 39–117)
BUN/Creatinine Ratio: 8 — ABNORMAL LOW (ref 9–20)
BUN: 8 mg/dL (ref 6–24)
Bilirubin Total: 0.7 mg/dL (ref 0.0–1.2)
CALCIUM: 9.3 mg/dL (ref 8.7–10.2)
CO2: 20 mmol/L (ref 20–29)
Chloride: 101 mmol/L (ref 96–106)
Creatinine, Ser: 0.98 mg/dL (ref 0.76–1.27)
GFR, EST AFRICAN AMERICAN: 103 mL/min/{1.73_m2} (ref >59)
GFR, EST NON AFRICAN AMERICAN: 89 mL/min/{1.73_m2} (ref >59)
GLOBULIN, TOTAL: 2.3 g/dL (ref 1.5–4.5)
Glucose: 94 mg/dL (ref 65–99)
POTASSIUM: 4.2 mmol/L (ref 3.5–5.2)
SODIUM: 139 mmol/L (ref 134–144)
Total Protein: 6.6 g/dL (ref 6.0–8.5)

## 2018-10-02 LAB — LIPID PANEL
CHOL/HDL RATIO: 5.6 ratio — AB (ref 0.0–5.0)
Cholesterol, Total: 213 mg/dL — ABNORMAL HIGH (ref 100–199)
HDL: 38 mg/dL — ABNORMAL LOW (ref >39)
LDL CALC: 139 mg/dL — AB (ref 0–99)
TRIGLYCERIDES: 178 mg/dL — AB (ref 0–149)
VLDL CHOLESTEROL CAL: 36 mg/dL (ref 5–40)

## 2018-12-08 ENCOUNTER — Other Ambulatory Visit: Payer: Self-pay | Admitting: Cardiovascular Disease

## 2019-02-20 ENCOUNTER — Encounter: Payer: Self-pay | Admitting: Gastroenterology

## 2019-03-15 ENCOUNTER — Other Ambulatory Visit: Payer: Self-pay | Admitting: Cardiovascular Disease

## 2019-03-18 ENCOUNTER — Encounter: Payer: Self-pay | Admitting: Cardiovascular Disease

## 2019-03-18 ENCOUNTER — Telehealth (INDEPENDENT_AMBULATORY_CARE_PROVIDER_SITE_OTHER): Payer: BLUE CROSS/BLUE SHIELD | Admitting: Gastroenterology

## 2019-03-18 ENCOUNTER — Other Ambulatory Visit: Payer: Self-pay

## 2019-03-18 ENCOUNTER — Encounter: Payer: Self-pay | Admitting: Gastroenterology

## 2019-03-18 VITALS — Ht 70.0 in | Wt 220.0 lb

## 2019-03-18 DIAGNOSIS — Z1211 Encounter for screening for malignant neoplasm of colon: Secondary | ICD-10-CM | POA: Diagnosis not present

## 2019-03-18 NOTE — Patient Instructions (Addendum)
Proceed with colonoscopy with miralax after COVID19   Discussed risks & benefits. (Risks including rare perforation req laparotomy, bleeding after biopsies/polypectomy req blood transfusion, rare chance of missing neoplasms, risks of anesthesia/sedation). Benefits outweigh the risks. Patient agrees to proceed. All the questions were answered. Consent forms given for review   Hold Plavix 5 days before. Would need cardiology clearence fro that (Dr Claiborne Billings).  You will be contacted by our office prior to your procedure for directions on holding your Plavix.  If you do not hear from our office 1 week prior to your scheduled procedure, please call [phone] to discuss.(Procedure not scheduled)

## 2019-03-18 NOTE — Progress Notes (Signed)
Chief Complaint: For screening colon  Referring Provider:  Dr Jannette Fogo Caryl Pina)   ASSESSMENT AND PLAN;   #1. Colorectal cancer screening #2. FH colonic polyps (father at age 52)  Plan: - Proceed with colonoscopy with miralax. Discussed risks & benefits. (Risks including rare perforation req laparotomy, bleeding after biopsies/polypectomy req blood transfusion, rare chance of missing neoplasms, risks of anesthesia/sedation). Benefits outweigh the risks. Patient agrees to proceed.  Would like COVID-19 scare to be over. - Hold Plavix 5 days before. Would need cardiology clearance from Dr. Claiborne Billings.    HPI:    Ricky Roberts is a 53 y.o. male  No nausea, vomiting, heartburn, regurgitation, odynophagia or dysphagia.  No significant diarrhea or constipation.  There is no melena or hematochezia. No unintentional weight loss.   2014 -had MI status post angioplasty with stents- Dr Claiborne Billings (has follow-up appointment Apr 24, 2019)  Denies having any chest pains, shortness of breath, wheezing or cough.  [facility] Proc -In process of obtaining records/colonoscopy -7 years ago ([facility])-colonic polyp status post polypectomy. Bx-hyperplastic   SH-lives in [place], married to [place] Past Medical History:  Diagnosis Date  . Heart attack ([place]) 2014    Past Surgical History:  Procedure Laterality Date  . CARDIAC SURGERY      Family History  Problem Relation Age of Onset  . Heart disease Mother   . High Cholesterol Mother   . Heart disease Father   . High Cholesterol Father     Social History   Tobacco Use  . Smoking status: Former Smoker    Last attempt to quit: 2012    Years since quitting: 8.2  . Smokeless tobacco: Never Used  Substance Use Topics  . Alcohol use: Yes    Alcohol/week: 6.0 standard drinks    Types: 6 Cans of beer per week    Comment: weekly. Liquor on weekends  . Drug use: Yes    Types: Marijuana    Current Outpatient Medications  Medication Sig Dispense Refill  .  aspirin EC 81 MG tablet Take 81 mg by mouth daily.    Marland Kitchen atorvastatin (LIPITOR) 80 MG tablet Take 80 mg by mouth every evening.    . carvedilol (COREG) 12.5 MG tablet Take 12.5 mg by mouth 2 (two) times daily.    . clopidogrel (PLAVIX) 75 MG tablet Take 75 mg by mouth daily.    . famotidine (PEPCID) 20 MG tablet Take 20 mg by mouth daily.    . isosorbide mononitrate (IMDUR) 30 MG 24 hr tablet Take 30 mg by mouth daily.    Marland Kitchen loratadine (CLARITIN) 10 MG tablet Take 10 mg by mouth daily.    . magnesium oxide (MAG-OX) 400 MG tablet Take 400 mg by mouth 2 (two) times daily.     No current facility-administered medications for this visit.     Allergies  Allergen Reactions  . Lisinopril Rash  . Ticagrelor Rash    Review of Systems:  Constitutional: Denies fever, chills, diaphoresis, appetite change and fatigue.  HEENT: Denies photophobia, eye pain, redness, hearing loss, ear pain, congestion, sore throat, rhinorrhea, sneezing, mouth sores, neck pain, neck stiffness and tinnitus.   Respiratory: Denies SOB, DOE, cough, chest tightness,  and wheezing.   Cardiovascular: Denies chest pain, palpitations and leg swelling.  Genitourinary: Denies dysuria, urgency, frequency, hematuria, flank pain and difficulty urinating.  Musculoskeletal: Denies myalgias, back pain, joint swelling, arthralgias and gait problem.  Skin: No rash.  Neurological: Denies dizziness, seizures, syncope, weakness, light-headedness, numbness and  headaches.  Hematological: Denies adenopathy. Easy bruising, personal or family bleeding history  Psychiatric/Behavioral: No anxiety or depression     Physical Exam:    Ht 5\' 10"  (1.778 m)   Wt 220 lb (99.8 kg)   BMI 31.57 kg/m  Filed Weights   03/18/19 1352  Weight: 220 lb (99.8 kg)   Not examined since it was a tele-visit  This service was provided via telemedicine.  The patient was located at home.  The provider was located in office.  The patient did consent to this  televisit and is aware of possible charges through their insurance for this visit.  The patient was referred by Dr. Jannette Fogo.   Time spent on call and coordination of care: 30 min      Carmell Austria, MD 03/18/2019, 2:56 PM  Cc: Dr Jannette Fogo.

## 2019-03-19 ENCOUNTER — Telehealth: Payer: Self-pay | Admitting: *Deleted

## 2019-03-19 ENCOUNTER — Encounter (HOSPITAL_COMMUNITY): Payer: BLUE CROSS/BLUE SHIELD

## 2019-03-19 NOTE — Telephone Encounter (Signed)
[facility] Pre-operative Risk Assessment     Request for surgical clearance:     Endoscopy Procedure  What type of surgery is being performed?     Colonoscopy  When is this surgery scheduled?     Not scheduled yet   What type of clearance is required ?   Pharmacy  Are there any medications that need to be held prior to surgery and how long? Plavix   Practice name and name of physician performing surgery?      [facility]  What is your office phone and fax number?      Phone- [phone]  [phone]  Anesthesia type (None, local, MAC, general) ?       MAC

## 2019-03-19 NOTE — Telephone Encounter (Signed)
Dr. Claiborne Billings, can plavix be held for colonoscopy?

## 2019-03-21 NOTE — Telephone Encounter (Signed)
   Primary Cardiologist: Shelva Majestic, MD  Chart reviewed as part of pre-operative protocol coverage.  Ricky Roberts was last seen on 05/28/18 by Dr. Claiborne Billings.   [facility] requested recommendations for holding plavix, not medical clearance.   Per Dr. Claiborne Billings: OK to hold plavix for at least 5 days prior to procedure.  I will route this recommendation to the requesting party via Epic fax function and remove from pre-op pool.  Please call with questions.  [name], PA 03/21/2019, 12:56 PM

## 2019-03-21 NOTE — Telephone Encounter (Signed)
Ok to hold plavix for at least 5 days prior to procedure

## 2019-03-24 NOTE — Telephone Encounter (Signed)
Pt made aware that he can hold Plavix 5 days prior to his procedure once it is scheduled. Informed that he would be contacted when we are able to do so. Pt understood.

## 2019-04-05 ENCOUNTER — Other Ambulatory Visit: Payer: Self-pay | Admitting: Cardiovascular Disease

## 2019-04-24 ENCOUNTER — Ambulatory Visit (HOSPITAL_COMMUNITY)
Admission: RE | Admit: 2019-04-24 | Discharge: 2019-04-24 | Disposition: A | Discharge status: Home / Self Care | Payer: BLUE CROSS/BLUE SHIELD | Source: Ambulatory Visit | Attending: Cardiology | Admitting: Cardiology

## 2019-04-24 ENCOUNTER — Ambulatory Visit (HOSPITAL_BASED_OUTPATIENT_CLINIC_OR_DEPARTMENT_OTHER)
Admission: RE | Admit: 2019-04-24 | Discharge: 2019-04-24 | Disposition: A | Discharge status: Home / Self Care | Payer: BLUE CROSS/BLUE SHIELD | Source: Ambulatory Visit | Attending: Cardiovascular Disease | Admitting: Cardiovascular Disease

## 2019-04-24 ENCOUNTER — Other Ambulatory Visit: Payer: Self-pay

## 2019-04-24 ENCOUNTER — Other Ambulatory Visit (HOSPITAL_COMMUNITY): Payer: Self-pay | Admitting: Cardiovascular Disease

## 2019-04-24 DIAGNOSIS — I739 Peripheral vascular disease, unspecified: Secondary | ICD-10-CM

## 2019-04-24 DIAGNOSIS — Z95828 Presence of other vascular implants and grafts: Secondary | ICD-10-CM

## 2019-04-29 ENCOUNTER — Other Ambulatory Visit: Payer: Self-pay

## 2019-04-29 DIAGNOSIS — Z95828 Presence of other vascular implants and grafts: Secondary | ICD-10-CM

## 2019-05-09 ENCOUNTER — Other Ambulatory Visit: Payer: Self-pay | Admitting: Cardiovascular Disease

## 2019-05-09 MED ORDER — CARVEDILOL 12.5 MG PO TABS: 12.5000 mg | ORAL_TABLET | Freq: Two times a day (BID) | ORAL | 1 refills | Status: DC

## 2019-05-27 ENCOUNTER — Other Ambulatory Visit: Payer: Self-pay | Admitting: Cardiovascular Disease

## 2019-05-29 ENCOUNTER — Encounter: Payer: Self-pay | Admitting: Gastroenterology

## 2019-06-10 ENCOUNTER — Other Ambulatory Visit: Payer: Self-pay | Admitting: Cardiovascular Disease

## 2019-06-17 ENCOUNTER — Other Ambulatory Visit: Payer: Self-pay | Admitting: Cardiovascular Disease

## 2019-06-18 ENCOUNTER — Encounter: Payer: Self-pay | Admitting: Gastroenterology

## 2019-06-19 ENCOUNTER — Other Ambulatory Visit: Payer: Self-pay | Admitting: Cardiovascular Disease

## 2019-06-24 ENCOUNTER — Telehealth: Payer: Self-pay

## 2019-06-24 NOTE — Telephone Encounter (Signed)
Patient return call. ?

## 2019-06-24 NOTE — Telephone Encounter (Signed)
Patient was scheduled for Pre-Visit today at 2:00 Pm. Patient was called twice and two messages were left on voicemail to call and reschedule his PV before 5:00 today or I will have to cancel his procedure per [place] guidelines. If patient does not re-schedule a no show letter will be mailed at the endo of the day.   Riki Sheer, LPN ( PV )

## 2019-07-08 ENCOUNTER — Encounter: Payer: BLUE CROSS/BLUE SHIELD | Admitting: Gastroenterology

## 2019-07-14 ENCOUNTER — Encounter: Payer: Self-pay | Admitting: Gastroenterology

## 2019-07-15 ENCOUNTER — Telehealth: Payer: Self-pay | Admitting: *Deleted

## 2019-07-15 NOTE — Telephone Encounter (Signed)
Dr. Lyndel Safe,  This pt is coming in for his PV on 07-23-19 and his colonoscopy is 08-06-19.  He has clearance to hold his Plavix for 5 days back on 03-19-19.  Is it still ok to use that clearance for his upcoming procedure?  Thanks, [employer]

## 2019-07-15 NOTE — Telephone Encounter (Signed)
noted 

## 2019-07-15 NOTE — Telephone Encounter (Signed)
I think it will be okay (cardiology clearance) Hold Plavix 5 days before. Thx  RG

## 2019-07-16 ENCOUNTER — Other Ambulatory Visit: Payer: Self-pay | Admitting: Cardiovascular Disease

## 2019-07-23 ENCOUNTER — Telehealth: Payer: Self-pay | Admitting: *Deleted

## 2019-07-23 NOTE — Telephone Encounter (Signed)
No return call received pre-visit and procedure cancelled,no show letter sent.

## 2019-07-23 NOTE — Telephone Encounter (Signed)
Message left to call back to reschedule pre-visit and if we have not heard from him by 5 p.m. we will have cancel procedure for 08/06/19

## 2019-08-06 ENCOUNTER — Encounter: Payer: BC Managed Care – PPO | Admitting: Gastroenterology

## 2019-08-12 ENCOUNTER — Encounter: Payer: Self-pay | Admitting: Gastroenterology

## 2019-08-19 ENCOUNTER — Other Ambulatory Visit: Payer: Self-pay | Admitting: Cardiology

## 2019-08-22 ENCOUNTER — Ambulatory Visit (AMBULATORY_SURGERY_CENTER): Payer: Self-pay | Admitting: *Deleted

## 2019-08-22 ENCOUNTER — Other Ambulatory Visit: Payer: Self-pay

## 2019-08-22 VITALS — Temp 96.9°F | Ht 70.0 in | Wt 216.0 lb

## 2019-08-22 DIAGNOSIS — Z8371 Family history of colonic polyps: Secondary | ICD-10-CM

## 2019-08-22 MED ORDER — SUPREP BOWEL PREP KIT 17.5-3.13-1.6 GM/177ML PO SOLN: 1.0000 | Freq: Once | ORAL | 0 refills | Status: AC

## 2019-08-22 NOTE — Progress Notes (Signed)
No egg or soy allergy known to patient  No issues with past sedation with any surgeries  or procedures, no intubation problems  No diet pills per patient No home 02 use per patient  Pt is on  blood thinners per patient onPlavix- OK to hold x 5 days per Cardio  Pt denies issues with constipation  No A fib or A flutter  EMMI video sent to pt's e mail   Due to the COVID-19 pandemic we are asking patients to follow these guidelines. Please only bring one care partner. Please be aware that your care partner may wait in the car in the parking lot or if they feel like they will be too hot to wait in the car, they may wait in the lobby on the 4th floor. All care partners are required to wear a mask the entire time (we do not have any that we can provide them), they need to practice social distancing, and we will do a Covid check for all patient's and care partners when you arrive. Also we will check their temperature and your temperature. If the care partner waits in their car they need to stay in the parking lot the entire time and we will call them on their cell phone when the patient is ready for discharge so they can bring the car to the front of the building. Also all patient's will need to wear a mask into building.  Suprep $15 coupon to pt today in [place]

## 2019-08-25 ENCOUNTER — Encounter: Payer: Self-pay | Admitting: Gastroenterology

## 2019-09-04 ENCOUNTER — Telehealth: Payer: Self-pay

## 2019-09-04 NOTE — Telephone Encounter (Signed)
Covid-19 screening questions   Do you now or have you had a fever in the last 14 days?  Do you have any respiratory symptoms of shortness of breath or cough now or in the last 14 days?  Do you have any family members or close contacts with diagnosed or suspected Covid-19 in the past 14 days?  Have you been tested for Covid-19 and found to be positive?       

## 2019-09-05 ENCOUNTER — Ambulatory Visit (AMBULATORY_SURGERY_CENTER): Payer: BC Managed Care – PPO | Admitting: Gastroenterology

## 2019-09-05 ENCOUNTER — Encounter: Payer: Self-pay | Admitting: Gastroenterology

## 2019-09-05 ENCOUNTER — Other Ambulatory Visit: Payer: Self-pay

## 2019-09-05 VITALS — BP 126/63 | HR 53 | Temp 98.7°F | Resp 21 | Ht 70.0 in | Wt 216.0 lb

## 2019-09-05 DIAGNOSIS — Z1211 Encounter for screening for malignant neoplasm of colon: Secondary | ICD-10-CM | POA: Diagnosis not present

## 2019-09-05 DIAGNOSIS — Z8371 Family history of colonic polyps: Secondary | ICD-10-CM

## 2019-09-05 DIAGNOSIS — K635 Polyp of colon: Secondary | ICD-10-CM

## 2019-09-05 DIAGNOSIS — Z83719 Family history of colon polyps, unspecified: Secondary | ICD-10-CM

## 2019-09-05 DIAGNOSIS — D124 Benign neoplasm of descending colon: Secondary | ICD-10-CM

## 2019-09-05 MED ORDER — SODIUM CHLORIDE 0.9 % IV SOLN: 500.0000 mL | Freq: Once | INTRAVENOUS | Status: AC

## 2019-09-05 NOTE — Patient Instructions (Signed)
Impression/Recomomendations.  Polyp handout given to patient. Hemorrhoid handout given to patient.  Resume previous diet. Continue present medications.  Await pathology results.  Resume Plavix (clopidogrel) at prior dose tomorrow.  YOU HAD AN ENDOSCOPIC PROCEDURE TODAY AT [facility]:   Refer to the procedure report that was given to you for any specific questions about what was found during the examination.  If the procedure report does not answer your questions, please call your gastroenterologist to clarify.  If you requested that your care partner not be given the details of your procedure findings, then the procedure report has been included in a sealed envelope for you to review at your convenience later.  YOU SHOULD EXPECT: Some feelings of bloating in the abdomen. Passage of more gas than usual.  Walking can help get rid of the air that was put into your GI tract during the procedure and reduce the bloating. If you had a lower endoscopy (such as a colonoscopy or flexible sigmoidoscopy) you may notice spotting of blood in your stool or on the toilet paper. If you underwent a bowel prep for your procedure, you may not have a normal bowel movement for a few days.  Please Note:  You might notice some irritation and congestion in your nose or some drainage.  This is from the oxygen used during your procedure.  There is no need for concern and it should clear up in a day or so.  SYMPTOMS TO REPORT IMMEDIATELY:   Following lower endoscopy (colonoscopy or flexible sigmoidoscopy):  Excessive amounts of blood in the stool  Significant tenderness or worsening of abdominal pains  Swelling of the abdomen that is new, acute  Fever of 100F or higher For urgent or emergent issues, a gastroenterologist can be reached at any hour by calling [phone].   DIET:  We do recommend a small meal at first, but then you may proceed to your regular diet.  Drink plenty of fluids but  you should avoid alcoholic beverages for 24 hours.  ACTIVITY:  You should plan to take it easy for the rest of today and you should NOT DRIVE or use heavy machinery until tomorrow (because of the sedation medicines used during the test).    FOLLOW UP: Our staff will call the number listed on your records 48-72 hours following your procedure to check on you and address any questions or concerns that you may have regarding the information given to you following your procedure. If we do not reach you, we will leave a message.  We will attempt to reach you two times.  During this call, we will ask if you have developed any symptoms of COVID 19. If you develop any symptoms (ie: fever, flu-like symptoms, shortness of breath, cough etc.) before then, please call [phone].  If you test positive for Covid 19 in the 2 weeks post procedure, please call and report this information to [place].    If any biopsies were taken you will be contacted by phone or by letter within the next 1-3 weeks.  Please call us at [phone] if you have not heard about the biopsies in 3 weeks.    SIGNATURES/CONFIDENTIALITY: You and/or your care partner have signed paperwork which will be entered into your electronic medical record.  These signatures attest to the fact that that the information above on your After Visit Summary has been reviewed and is understood.  Full responsibility of the confidentiality of this discharge information lies with you  and/or your care-partner. 

## 2019-09-05 NOTE — Op Note (Signed)
[facility] Patient Name: Ricky Roberts Procedure Date: 09/05/2019 8:42 AM MRN: [id number] Endoscopist: Jackquline Denmark , MD Age: 53 Referring MD:  Date of Birth: [birth year] Gender: Male Account #: [id number] Procedure:                Colonoscopy Indications:              Colon cancer screening in patient at increased                            risk: Family history of 1st-degree relative with                            colon polyps before age 32 years Medicines:                Monitored Anesthesia Care Procedure:                Pre-Anesthesia Assessment:                           - Prior to the procedure, a History and Physical                            was performed, and patient medications and                            allergies were reviewed. The patient's tolerance of                            previous anesthesia was also reviewed. The risks                            and benefits of the procedure and the sedation                            options and risks were discussed with the patient.                            All questions were answered, and informed consent                            was obtained. Prior Anticoagulants: The patient has                            taken Plavix (clopidogrel), last dose was 5 days                            prior to procedure. ASA Grade Assessment: II - A                            patient with mild systemic disease. After reviewing                            the risks and benefits, the patient was deemed in  satisfactory condition to undergo the procedure.                           After obtaining informed consent, the colonoscope                            was passed under direct vision. Throughout the                            procedure, the patient's blood pressure, pulse, and                            oxygen saturations were monitored continuously. The                            Colonoscope was  introduced through the anus and                            advanced to the 2 cm into the ileum. The                            colonoscopy was performed without difficulty. The                            patient tolerated the procedure well. The quality                            of the bowel preparation was good. The ileocecal                            valve, appendiceal orifice, and rectum were                            photographed. Scope In: 8:55:46 AM Scope Out: 9:03:50 AM Scope Withdrawal Time: 0 hours 6 minutes 8 seconds  Total Procedure Duration: 0 hours 8 minutes 4 seconds  Findings:                 A 4 mm polyp was found in the mid descending colon.                            The polyp was sessile. The polyp was removed with a                            cold snare. Resection and retrieval were complete.                           Non-bleeding internal hemorrhoids were found during                            retroflexion. The hemorrhoids were small.                           The terminal ileum appeared normal.  The exam was otherwise without abnormality on                            direct and retroflexion views. Complications:            No immediate complications. Estimated Blood Loss:     Estimated blood loss: none. Impression:               - One 4 mm polyp in the mid descending colon,                            removed with a cold snare. Resected and retrieved.                           - Non-bleeding internal hemorrhoids.                           - The examined portion of the ileum was normal.                           - The examination was otherwise normal on direct                            and retroflexion views. Recommendation:           - Patient has a contact number available for                            emergencies. The signs and symptoms of potential                            delayed complications were discussed with the                             patient. Return to normal activities tomorrow.                            Written discharge instructions were provided to the                            patient.                           - Resume previous diet.                           - Continue present medications.                           - Await pathology results.                           - Resume Plavix (clopidogrel) at prior dose                            tomorrow. Jackquline Denmark, MD 09/05/2019 9:08:02 AM This report has been signed electronically.

## 2019-09-05 NOTE — Progress Notes (Signed)
To PACU, VSS. Report to [email or web address] 

## 2019-09-05 NOTE — Progress Notes (Signed)
Pt's states no medical or surgical changes since previsit or office visit.  Temp KA VS [place]

## 2019-09-09 ENCOUNTER — Telehealth: Payer: Self-pay

## 2019-09-09 NOTE — Telephone Encounter (Signed)
Follow up call attempted.  NALM  

## 2019-09-09 NOTE — Telephone Encounter (Signed)
  Follow up Call-  Call back number 09/05/2019  Post procedure Call Back phone  # [phone]  Permission to leave phone message Yes  Some recent data might be hidden     Patient questions:  Do you have a fever, pain , or abdominal swelling? No. Pain Score  0 *  Have you tolerated food without any problems? Yes.    Have you been able to return to your normal activities? Yes.    Do you have any questions about your discharge instructions: Diet   No. Medications  No. Follow up visit  No.  Do you have questions or concerns about your Care? No.  Actions: * If pain score is 4 or above: 1. No action needed, pain <4.Have you developed a fever since your procedure? no  2.   Have you had an respiratory symptoms (SOB or cough) since your procedure? no  3.   Have you tested positive for COVID 19 since your procedure no  4.   Have you had any family members/close contacts diagnosed with the COVID 19 since your procedure?  no   If yes to any of these questions please route to Joylene John, RN and Alphonsa Gin, Therapist, sports.

## 2019-09-16 ENCOUNTER — Encounter: Payer: Self-pay | Admitting: Gastroenterology

## 2019-09-20 ENCOUNTER — Other Ambulatory Visit: Payer: Self-pay | Admitting: Cardiology

## 2019-10-01 ENCOUNTER — Other Ambulatory Visit: Payer: Self-pay | Admitting: Cardiovascular Disease

## 2019-10-29 ENCOUNTER — Other Ambulatory Visit: Payer: Self-pay | Admitting: Cardiovascular Disease

## 2019-11-11 ENCOUNTER — Other Ambulatory Visit: Payer: Self-pay | Admitting: Cardiovascular Disease

## 2019-11-21 ENCOUNTER — Other Ambulatory Visit: Payer: Self-pay | Admitting: Cardiovascular Disease

## 2019-11-24 NOTE — Telephone Encounter (Signed)
Please advise if ok to refill non cardiac medication.  Famotidine (Pepcid) 20 mg tablet qd.

## 2019-11-25 ENCOUNTER — Other Ambulatory Visit: Payer: Self-pay | Admitting: Cardiovascular Disease

## 2019-12-06 ENCOUNTER — Other Ambulatory Visit: Payer: Self-pay | Admitting: Cardiovascular Disease

## 2019-12-22 ENCOUNTER — Encounter: Payer: Self-pay | Admitting: Cardiovascular Disease

## 2019-12-22 ENCOUNTER — Other Ambulatory Visit: Payer: Self-pay

## 2019-12-22 ENCOUNTER — Ambulatory Visit (INDEPENDENT_AMBULATORY_CARE_PROVIDER_SITE_OTHER): Payer: 59 | Admitting: Cardiovascular Disease

## 2019-12-22 DIAGNOSIS — Z955 Presence of coronary angioplasty implant and graft: Secondary | ICD-10-CM

## 2019-12-22 DIAGNOSIS — E785 Hyperlipidemia, unspecified: Secondary | ICD-10-CM

## 2019-12-22 DIAGNOSIS — Z95828 Presence of other vascular implants and grafts: Secondary | ICD-10-CM | POA: Diagnosis not present

## 2019-12-22 DIAGNOSIS — Z79899 Other long term (current) drug therapy: Secondary | ICD-10-CM

## 2019-12-22 DIAGNOSIS — I2510 Atherosclerotic heart disease of native coronary artery without angina pectoris: Secondary | ICD-10-CM | POA: Diagnosis not present

## 2019-12-22 DIAGNOSIS — G4733 Obstructive sleep apnea (adult) (pediatric): Secondary | ICD-10-CM

## 2019-12-22 LAB — COMPREHENSIVE METABOLIC PANEL
ALT: 20 IU/L (ref 0–44)
AST: 23 IU/L (ref 0–40)
Albumin/Globulin Ratio: 1.9 (ref 1.2–2.2)
Albumin: 4.4 g/dL (ref 3.8–4.9)
Alkaline Phosphatase: 57 IU/L (ref 39–117)
BUN/Creatinine Ratio: 10 (ref 9–20)
BUN: 9 mg/dL (ref 6–24)
Bilirubin Total: 0.6 mg/dL (ref 0.0–1.2)
CO2: 23 mmol/L (ref 20–29)
Calcium: 9.3 mg/dL (ref 8.7–10.2)
Chloride: 103 mmol/L (ref 96–106)
Creatinine, Ser: 0.9 mg/dL (ref 0.76–1.27)
GFR calc Af Amer: 113 mL/min/{1.73_m2} (ref >59)
GFR calc non Af Amer: 98 mL/min/{1.73_m2} (ref >59)
Globulin, Total: 2.3 g/dL (ref 1.5–4.5)
Glucose: 85 mg/dL (ref 65–99)
Potassium: 4.5 mmol/L (ref 3.5–5.2)
Sodium: 141 mmol/L (ref 134–144)
Total Protein: 6.7 g/dL (ref 6.0–8.5)

## 2019-12-22 LAB — LIPID PANEL
Chol/HDL Ratio: 2.8 ratio (ref 0.0–5.0)
Cholesterol, Total: 152 mg/dL (ref 100–199)
HDL: 55 mg/dL (ref >39)
LDL Chol Calc (NIH): 76 mg/dL (ref 0–99)
Triglycerides: 115 mg/dL (ref 0–149)
VLDL Cholesterol Cal: 21 mg/dL (ref 5–40)

## 2019-12-22 LAB — CBC
Hematocrit: 45.4 % (ref 37.5–51.0)
Hemoglobin: 15.4 g/dL (ref 13.0–17.7)
MCH: 30.5 pg (ref 26.6–33.0)
MCHC: 33.9 g/dL (ref 31.5–35.7)
MCV: 90 fL (ref 79–97)
Platelets: 152 10*3/uL (ref 150–450)
RBC: 5.05 x10E6/uL (ref 4.14–5.80)
RDW: 13.9 % (ref 11.6–15.4)
WBC: 7.9 10*3/uL (ref 3.4–10.8)

## 2019-12-22 LAB — TSH: TSH: 1.62 u[IU]/mL (ref 0.450–4.500)

## 2019-12-22 MED ORDER — FAMOTIDINE 20 MG PO TABS: 20.0000 mg | ORAL_TABLET | Freq: Every day | ORAL | 3 refills | Status: DC

## 2019-12-22 MED ORDER — EZETIMIBE 10 MG PO TABS: 10.0000 mg | ORAL_TABLET | Freq: Every day | ORAL | 3 refills | Status: DC

## 2019-12-22 MED ORDER — ATORVASTATIN CALCIUM 80 MG PO TABS: 80.0000 mg | ORAL_TABLET | Freq: Every day | ORAL | 3 refills | Status: DC

## 2019-12-22 NOTE — Progress Notes (Signed)
Patient ID: Ricky Roberts, male   DOB: [birth year], 54 y.o.   MRN: [id number]     HPI: Ricky Roberts is a 54 y.o. male who presents to the office for a 21 month cardiology evaluation.   Ricky Roberts suffered an an acute coronary syndrome/ST segment elevation myocardial infarction inferolaterally in the setting of a cardiac arrest secondary to torsade du pointes requiring defibrillation on 03/29/2013.  He was taken emergently to the cardiac catheterization laboratory by me and found  to have a totally occluded proximal circumflex and 85-90% stenosis in his mid RCA. He underwent successful intervention to his circumflex vessel after undergoing thrombectomy which removed significant thrombus. A Xience Xpedition 3.0x15 mm DES stent was inserted which was postdilated 3.3 mm. He was also noted to have a 90% stenosis in his distal circumflex which underwent PTCA and was reduced to less than 30%. There also is mild disease in the OM1 vessel of 50 and 30%.  He left the laboratory with TIMI 3 flow. 2 days later he underwent staged PCI to his RCA.   His circumflex stent was found to be widely patent. He underwent  angiosculpt cutting balloon, and ultimate stenting of the RCA with a 3.25x18 Xience DES stent postdilated to 3.5 mm the 85-90% stenosis reduced to percent. He also underwent PTCA of the acute marginal branch through the stent strut which improved from 95% to less than 40%. He developed a rash which was felt possibly due to Brilinta and had intolerance to lisinopril.  He has been treated with Effient and low-dose aspirin since his cardiac event.   A nuclear perfusion study on 05/09/2013 showed reduced LV function with severe hypokinesis to akinesis in the inferior temporal lateral collateral region consistent with his prior infarction. Due to high grade iliac stenosis, he subsequently underwent peripheral intervention by Dr. Gwenlyn Found to his right common iliac artery on 06/02/2013. He has felt improved and no longer  experiences claudication. He denies any arrhythmias. He denies palpitations.  Amiodarone was weaned and ultimately discontinued without further arrhythmias.  A followup of duplex scan to his lower extremity in January 2015 suggested an open and patent right common iliac stent without evidence for restenosis.  ABIs were normal bilaterally.  A 1.2.  In January 2015, a followup echo Doppler study showed an ejection fraction of 40-45%.  There was mild mitral regurgitation.  The grade 1 diastolic dysfunction without regional wall motion abnormalities.  When I saw him one year ago, he was sleeping poorly, admitted to fatigue, daytime sleepiness, and had frequent awakenings with loud snoring.  He denied any recurrent anginal symptomatology or claudication or change in exercise tolerance.  Due to concerns for obstructive sleep apnea I scheduled him for sleep study which was done on 05/11/2016.  This was notable for moderate sleep apnea with an HI of 24.6; however, dense were severe with supine sleep with an HI of 33.8, and doing rems sleep with an AHI 41.4.  He had moderate oxygen desaturation to 85%.  Split-night study was done with institution of CPAP.  Initial pressure of 7 cm was recommended.  Unfortunately, without ever coming back to the sleep clinic, he had to give back his machine in October 2017 due to noncompliance.  When I last saw him in August 2018.  He was not sleeping well.  He was waking up at least 3-4 times per night and had significant daytime sleepiness.  He denied any recurrent chest pain or palpitations.    He had a  reset up of CPAP therapy on June 04, 2017 with a ResMed10 AutoSet unit and was set at 7 cm water pressure.  A download was obtained  from 10/22/2017 through 11/20/2017.  Usage stays was 77%.  However, usage greater than 4 hours was only 37%.  He was only averaging 3 hours and 33 minutes of CPAP use.  At a 7 cm water pressure.  AHI was 8.5.  His time to meet compliance is 11/27/2017.   An Epworth sleepiness scale score was recalculated in the office today and this was elevated and endorsed at 11, consistent with daytime sleepiness.    I saw him in December 2018.  Unfortunately due to not meeting compliance standards, he had to turn the machine although there was discussion about trying to obtain the machine for minimal cost.  He was scheduled for reinitiation of CPAP therapy with the [facility].  He denies any chest pain.  He denies palpitations.  He continues to be on a baby aspirin and Plavix 75 mg.  He is on carvedilol 12.5 mg twice a day, isosorbide 30 mg daily.  He continues to be on atorvastatin 80 mg with target LDL less than 70.    Since I last saw him in June 2019, he tells me that he did receive a new ResMed machine at the [facility] hospital.  He has been intermittently using this but typically only uses it for approximately 3 to 4 hours at most per night.  He typically goes to bed at 930 and wakes up around 5 AM.  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  He denies associated chest pain.  He denies PND orthopnea.  He denies heart failure symptoms.  He is not had recent laboratory.  He presents for evaluation.  Past Medical History:  Diagnosis Date  . Acute MI ([place])    2D ECHO, 03/31/2013 - EF 40-45%. normal  . Allergy   . CAD (coronary artery disease), residual RCA stenosis with recurrent angina 03/31/2013  . Cardiac arrest, secondary to torsodes with 4 shocks by EMS  03/31/2013  . CHF (congestive heart failure) ([place])   . GERD (gastroesophageal reflux disease)   . Heart attack ([place]) 2014  . Heart murmur   . Hyperlipidemia   . Hypertension   . Peripheral vascular disease with claudication ([facility])    90% ostial right common iliac artery stenosis discovered at the time of cardiac catheterization with an abnormal Doppler study  . S/P angioplasty with stent 03/31/13 to RV ostial marginal with DES Xience Xpedition stent 04/01/2013  .  Sleep apnea    no cpap     Past Surgical History:  Procedure Laterality Date  . CARDIAC SURGERY    . COLONOSCOPY    . CORONARY STENT PLACEMENT  4/27-28/2014   DES to LCx (STEMI culprit); staged PCI to RCA for post STEMI Angina  . FRACTURE SURGERY     both ankles as a child  . LEFT HEART CATHETERIZATION WITH CORONARY ANGIOGRAM N/A 03/29/2013   Procedure: LEFT HEART CATHETERIZATION WITH CORONARY ANGIOGRAM;  Surgeon: Troy Sine, MD;  Location: [facility] CATH LAB;  Service: Cardiovascular;  Laterality: N/A;  . LOWER EXTREMITY ANGIOGRAM N/A 06/02/2013   Procedure: LOWER EXTREMITY ANGIOGRAM;  Surgeon: Lorretta Harp, MD;  Location: [facility] CATH LAB;  Service: Cardiovascular;  Laterality: N/A;  . PERCUTANEOUS CORONARY STENT INTERVENTION (PCI-S) Bilateral 03/31/2013   Procedure: PERCUTANEOUS CORONARY STENT INTERVENTION (PCI-S);  Surgeon: Troy Sine,  MD;  Location: [facility] CATH LAB;  Service: Cardiovascular;  Laterality: Bilateral;  . PERCUTANEOUS STENT INTERVENTION Right 06/02/2013   Procedure: PERCUTANEOUS STENT INTERVENTION;  Surgeon: Lorretta Harp, MD;  Location: [facility] CATH LAB;  Service: Cardiovascular;  Laterality: Right;  . POLYPECTOMY    . WISDOM TOOTH EXTRACTION      Allergies  Allergen Reactions  . Lisinopril Rash    rash  . Ticagrelor Rash  . Ticagrelor Rash    Current Outpatient Medications  Medication Sig Dispense Refill  . aspirin EC 81 MG tablet Take 81 mg by mouth daily.    . carvedilol (COREG) 12.5 MG tablet TAKE 1 TABLET BY MOUTH TWICE DAILY WITH A MEAL 60 tablet 10  . clopidogrel (PLAVIX) 75 MG tablet TAKE 1 TABLET BY MOUTH EVERY DAY 90 tablet 3  . ezetimibe (ZETIA) 10 MG tablet Take 1 tablet (10 mg total) by mouth daily. 90 tablet 3  . famotidine (PEPCID) 20 MG tablet Take 1 tablet (20 mg total) by mouth daily. 90 tablet 3  . ibuprofen (ADVIL,MOTRIN) 200 MG tablet Take 400 mg by mouth daily as needed for pain.    . isosorbide mononitrate (IMDUR) 30 MG 24 hr tablet TAKE 1  TABLET BY MOUTH EVERY DAY 90 tablet 3  . loratadine (CLARITIN) 10 MG tablet Take 1 tablet (10 mg total) by mouth daily. 30 tablet 0  . magnesium oxide (MAG-OX) 400 MG tablet TAKE 1 TABLET BY MOUTH TWICE A DAY NEEDS OFFICE VISIT 60 tablet 3  . nitroGLYCERIN (NITROSTAT) 0.4 MG SL tablet Place 1 tablet (0.4 mg total) under the tongue every 5 (five) minutes as needed for chest pain. 25 tablet 1  . rosuvastatin (CRESTOR) 20 MG tablet Take 20 mg by mouth daily.     Current Facility-Administered Medications  Medication Dose Route Frequency Provider Last Rate Last Admin  . 0.9 %  sodium chloride infusion  500 mL Intravenous Once Jackquline Denmark, MD        Socially he is married. One child. He does exercise with walking. There is remote tobacco history having quit in April 2013. He does drink alcohol. He did smoke occasional marijuana.  ROS General: Negative; No fevers, chills, or night sweats; no significant weight loss HEENT: Negative; No changes in vision or hearing, sinus congestion, difficulty swallowing Pulmonary: Negative; No cough, wheezing, shortness of breath, hemoptysis Cardiovascular: See history of present illness No claudication symptoms GI: Negative; No nausea, vomiting, diarrhea, or abdominal pain GU: Negative; No dysuria, hematuria, or difficulty voiding Musculoskeletal: Negative; no myalgias, joint pain, or weakness Hematologic/Oncology: Negative; no easy bruising, bleeding Endocrine: Negative; no heat/cold intolerance; no diabetes Neuro: Negative; no changes in balance, headaches Skin: Negative; No rashes or skin lesions Psychiatric: Negative; No behavioral problems, depression Sleep: Positive for obstructive sleep apnea with snoring, daytime sleepiness, hypersomnolence; no bruxism, restless legs, hypnogognic hallucinations, no cataplexy Other comprehensive 14 point system review is negative.   PE BP 122/74   Pulse 62   Temp (!) 96.7 F (35.9 C)   Ht 5' 10"  (1.778 m)   Wt  219 lb (99.3 kg)   SpO2 98%   BMI 31.42 kg/m    Repeat blood pressure by me was 128/78  Wt Readings from Last 3 Encounters:  12/22/19 219 lb (99.3 kg)  09/05/19 216 lb (98 kg)  08/22/19 216 lb (98 kg)   General: Alert, oriented, no distress.  Skin: normal turgor, no rashes, warm and dry HEENT: Normocephalic, atraumatic. Pupils equal round and reactive  to light; sclera anicteric; extraocular muscles intact;  Nose without nasal septal hypertrophy Mouth/Parynx benign; Mallinpatti scale 3 Neck: No JVD, no carotid bruits; normal carotid upstroke Lungs: clear to ausculatation and percussion; no wheezing or rales Chest wall: without tenderness to palpitation Heart: PMI not displaced, RRR, s1 s2 normal, 1/6 systolic murmur, no diastolic murmur, no rubs, gallops, thrills, or heaves Abdomen: soft, nontender; no hepatosplenomehaly, BS+; abdominal aorta nontender and not dilated by palpation. Back: no CVA tenderness Pulses 2+ Musculoskeletal: full range of motion, normal strength, no joint deformities Extremities: no clubbing cyanosis or edema, Homan's sign negative  Neurologic: grossly nonfocal; Cranial nerves grossly wnl Psychologic: Normal mood and affect   ECG (independently read by me): Sinus Bradycardia at 52; nonspecific T changes  June 2019 ECG (independently read by me): Normal sinus rhythm at 61 bpm.  Early transition.  Early repolarization changes.  December 2018 ECG (independently read by me): Normal sinus rhythm at 61 bpm.  Mild RV conduction delay.  Early transition consistent with probable posterior MI  April 2017 ECG (independently read by me): Sinus bradycardia at 53 bpm with mild sinus arrhythmia.  Early transition in V2.  Nonspecific T changes.  February 2016 ECG (independently read by me): Normal sinus rhythm at 62 bpm.  In fear Q wave in 3 nondiagnostic and aVF. Early transition  June 2015 ECG: Sinus bradycardia 52 beats per minute.  Early transition, compatible with  his posterior wall MI.  One isolated PVC.  QTc interval 411 milliseconds.  PR interval 152 msec  October 2014 ECG: Normal sinus rhythm at 60 beats per minute. Prominent R wave in V2 suggesting posterior wall involvement. No significant ST changes.  LABS:  BMP Latest Ref Rng & Units 12/22/2019 10/02/2018 05/28/2018  Glucose 65 - 99 mg/dL 85 94 94  BUN 6 - 24 mg/dL 9 8 8   Creatinine 0.76 - 1.27 mg/dL 0.90 0.98 0.95  BUN/Creat Ratio 9 - 20 10 8(L) 8(L)  Sodium 134 - 144 mmol/L 141 139 142  Potassium 3.5 - 5.2 mmol/L 4.5 4.2 4.3  Chloride 96 - 106 mmol/L 103 101 107(H)  CO2 20 - 29 mmol/L 23 20 23   Calcium 8.7 - 10.2 mg/dL 9.3 9.3 9.0     Hepatic Function Latest Ref Rng & Units 12/22/2019 10/02/2018 05/28/2018  Total Protein 6.0 - 8.5 g/dL 6.7 6.6 6.5  Albumin 3.8 - 4.9 g/dL 4.4 4.3 4.3  AST 0 - 40 IU/L 23 26 20   ALT 0 - 44 IU/L 20 24 24   Alk Phosphatase 39 - 117 IU/L 57 55 61  Total Bilirubin 0.0 - 1.2 mg/dL 0.6 0.7 0.6  Bilirubin, Direct <=0.2 mg/dL - - -     CBC 12/22/2019 05/28/2018 03/17/2016  WBC WILL FOLLOW 6.0 7.1  Hemoglobin WILL FOLLOW 14.3 15.9  Hematocrit WILL FOLLOW 41.7 45.4  Platelets WILL FOLLOW 147(L) 148   Lab Results  Component Value Date   MCV WILL FOLLOW 12/22/2019   MCV 87 05/28/2018   MCV 88.0 03/17/2016    Lab Results  Component Value Date   TSH 1.620 12/22/2019   Lab Results  Component Value Date   HGBA1C 5.4 04/01/2013   Lipid Panel     Component Value Date/Time   CHOL 152 12/22/2019 0852   TRIG 115 12/22/2019 0852   HDL 55 12/22/2019 0852   CHOLHDL 2.8 12/22/2019 0852   CHOLHDL 3.1 03/17/2016 1004   VLDL 25 03/17/2016 1004   LDLCALC 76 12/22/2019 0852  RADIOLOGY: No results found.  IMPRESSION:  1. Coronary artery disease involving native coronary artery of native heart without angina pectoris   2. S/P coronary artery stent placement, LCX, emergently 03/29/13 and staged to RCA   3. Hyperlipidemia with target LDL less than 70    4. S/P insertion of iliac artery stent   5. OSA (obstructive sleep apnea)   6. Medication management     ASSESSMENT AND PLAN: Ricky Roberts is a 54 year old African American gentleman who suffered an ST segment elevation myocardial infarction on 03/29/2013 secondary to circumflex occlusion acutely with VT/VF requiring successful defibrillation and PTCA/stenting of his circumflex vessel with subsequent staged intervention to his right coronary artery several days later on 03/31/2013. He has not had any arrhythmia since that time. He also was found to have high-grade right common iliac stenosis and is underwent successful percutaneous coronary intervention for his peripheral vascular disease. Prior to that procedure, a nuclear study did not demonstrate any ischemia and demonstrated inferior to inferolateral scar c/w the circumflex infarction. Of note, ejection fraction on nuclear study was 32%.  A followup echo Doppler study showed an ejection fraction of 40-45% with grade 1 diastolic dysfunction and without definitive wall motion abnormality.  A follow-up duplex scan of his lower extremity continues to suggest patency of his right common iliac stent.  Presently, he denies any recurrent anginal symptoms and continues to be on DAPT therapy with aspirin/Plavix.  His blood pressure today is well controlled on a regimen consisting of carvedilol 12.5 mg twice a day, isosorbide 30 mg daily.  He no longer is on atorvastatin and was started on rosuvastatin 20 mg in addition to previously being on Zetia.  However, he ran out of his Zetia over 1 month ago and is not taken this since.  I again reviewed his data regarding his obstructive sleep apnea.  He apparently received a new machine at the [facility].  I discussed the severity of his sleep apnea particularly during REM sleep where his AHI on prior testing was 41.4/h.  We discussed the importance of using CPAP for much longer duration particularly since the  preponderance of REM sleep occurs in the second half of the night.  BMI is 31.42.  Weight loss and increased exercise was recommended.  He is fasting today.  I will recheck fasting laboratory.  If his LDL cholesterol is above 70 Zetia will be resumed and/or rosuvastatin will be further titrated to 40 mg.  He is not having any claudication issues following his iliac intervention . He will follow-up at the [facility] and with his primary provider.  I will see him in 1 year for cardiology reevaluation.  Time spent: 25 minutes Troy Sine, MD, [facility]  12/22/2019 6:23 PM

## 2019-12-22 NOTE — Patient Instructions (Signed)
Medication Instructions:  Continue current medications  *If you need a refill on your cardiac medications before your next appointment, please call your pharmacy*  Lab Work: CBC. CMP, TSH and Fasting Lipids  If you have labs (blood work) drawn today and your tests are completely normal, you will receive your results only by: Marland Kitchen MyChart Message (if you have MyChart) OR . A paper copy in the mail If you have any lab test that is abnormal or we need to change your treatment, we will call you to review the results.  Testing/Procedures: None Ordered  Follow-Up: At [facility], you and your health needs are our priority.  As part of our continuing mission to provide you with exceptional heart care, we have created designated Provider Care Teams.  These Care Teams include your primary Cardiologist (physician) and Advanced Practice Providers (APPs -  Physician Assistants and Nurse Practitioners) who all work together to provide you with the care you need, when you need it.  Your next appointment:   1 year(s)  The format for your next appointment:   In Person  Provider:   Shelva Majestic, MD

## 2019-12-23 ENCOUNTER — Other Ambulatory Visit: Payer: Self-pay | Admitting: Cardiovascular Disease

## 2020-01-06 ENCOUNTER — Telehealth: Payer: Self-pay

## 2020-01-06 NOTE — Telephone Encounter (Signed)
LVM with lab results from Dr. Claiborne Billings "CBC stable; chemistries normal; TSH normal; lipid studies significantly improved" notified that if there were any questions or concerns to call our office.

## 2020-01-09 ENCOUNTER — Encounter: Payer: Self-pay | Admitting: Cardiovascular Disease

## 2020-03-27 ENCOUNTER — Other Ambulatory Visit: Payer: Self-pay | Admitting: Cardiovascular Disease

## 2020-04-07 ENCOUNTER — Other Ambulatory Visit: Payer: Self-pay | Admitting: Cardiovascular Disease

## 2020-04-23 ENCOUNTER — Ambulatory Visit (HOSPITAL_BASED_OUTPATIENT_CLINIC_OR_DEPARTMENT_OTHER)
Admission: RE | Admit: 2020-04-23 | Discharge: 2020-04-23 | Disposition: A | Discharge status: Home / Self Care | Payer: 59 | Source: Ambulatory Visit | Attending: Cardiovascular Disease | Admitting: Cardiovascular Disease

## 2020-04-23 ENCOUNTER — Other Ambulatory Visit: Payer: Self-pay

## 2020-04-23 ENCOUNTER — Ambulatory Visit (HOSPITAL_COMMUNITY)
Admission: RE | Admit: 2020-04-23 | Discharge: 2020-04-23 | Disposition: A | Discharge status: Home / Self Care | Payer: 59 | Source: Ambulatory Visit | Attending: Internal Medicine | Admitting: Internal Medicine

## 2020-04-23 ENCOUNTER — Other Ambulatory Visit (HOSPITAL_COMMUNITY): Payer: Self-pay | Admitting: Cardiovascular Disease

## 2020-04-23 DIAGNOSIS — Z95828 Presence of other vascular implants and grafts: Secondary | ICD-10-CM | POA: Insufficient documentation

## 2020-04-23 DIAGNOSIS — I739 Peripheral vascular disease, unspecified: Secondary | ICD-10-CM | POA: Diagnosis not present

## 2020-04-26 ENCOUNTER — Telehealth: Payer: Self-pay

## 2020-04-26 DIAGNOSIS — I739 Peripheral vascular disease, unspecified: Secondary | ICD-10-CM

## 2020-04-26 NOTE — Telephone Encounter (Signed)
Called patient aorta/IVC/Iliac doppler results left on personal voice mail.Advised to repeat in 12 months.

## 2020-10-19 ENCOUNTER — Other Ambulatory Visit: Payer: Self-pay | Admitting: Cardiovascular Disease

## 2021-01-08 ENCOUNTER — Other Ambulatory Visit: Payer: Self-pay | Admitting: Cardiovascular Disease

## 2021-01-25 ENCOUNTER — Other Ambulatory Visit: Payer: Self-pay | Admitting: Cardiovascular Disease

## 2021-02-01 ENCOUNTER — Ambulatory Visit: Payer: 59 | Admitting: Cardiovascular Disease

## 2021-02-23 ENCOUNTER — Other Ambulatory Visit: Payer: Self-pay | Admitting: Cardiovascular Disease

## 2021-03-01 ENCOUNTER — Ambulatory Visit: Payer: No Typology Code available for payment source | Admitting: Cardiovascular Disease

## 2021-03-01 ENCOUNTER — Encounter: Payer: Self-pay | Admitting: Cardiovascular Disease

## 2021-03-01 ENCOUNTER — Other Ambulatory Visit: Payer: Self-pay

## 2021-03-01 DIAGNOSIS — G4733 Obstructive sleep apnea (adult) (pediatric): Secondary | ICD-10-CM

## 2021-03-01 DIAGNOSIS — Z955 Presence of coronary angioplasty implant and graft: Secondary | ICD-10-CM | POA: Diagnosis not present

## 2021-03-01 DIAGNOSIS — Z95828 Presence of other vascular implants and grafts: Secondary | ICD-10-CM

## 2021-03-01 DIAGNOSIS — I739 Peripheral vascular disease, unspecified: Secondary | ICD-10-CM

## 2021-03-01 DIAGNOSIS — E785 Hyperlipidemia, unspecified: Secondary | ICD-10-CM

## 2021-03-01 NOTE — Patient Instructions (Signed)
Medication Instructions:  The current medical regimen is effective;  continue present plan and medications.  *If you need a refill on your cardiac medications before your next appointment, please call your pharmacy*   Lab Work: Fasting lab work (CBC, CMET, TSH, LIPID) -no lab appointment needed, come fasting (nothing to eat or drink)  If you have labs (blood work) drawn today and your tests are completely normal, you will receive your results only by: Marland Kitchen MyChart Message (if you have MyChart) OR . A paper copy in the mail If you have any lab test that is abnormal or we need to change your treatment, we will call you to review the results.   Follow-Up: At [facility], you and your health needs are our priority.  As part of our continuing mission to provide you with exceptional heart care, we have created designated Provider Care Teams.  These Care Teams include your primary Cardiologist (physician) and Advanced Practice Providers (APPs -  Physician Assistants and Nurse Practitioners) who all work together to provide you with the care you need, when you need it.  We recommend signing up for the patient portal called "MyChart".  Sign up information is provided on this After Visit Summary.  MyChart is used to connect with patients for Virtual Visits (Telemedicine).  Patients are able to view lab/test results, encounter notes, upcoming appointments, etc.  Non-urgent messages can be sent to your provider as well.   To learn more about what you can do with MyChart, go to [email or web address].    Your next appointment:   12 month(s)  The format for your next appointment:   In Person  Provider:   Shelva Majestic, MD

## 2021-03-01 NOTE — Progress Notes (Signed)
Patient ID: Ricky Roberts, male   DOB: 1966-04-21, 55 y.o.   MRN: [id number]     HPI: Ricky Roberts is a 55 y.o. male who presents to the office for a 13 month cardiology evaluation.   Ricky Roberts suffered an an acute coronary syndrome/ST segment elevation myocardial infarction inferolaterally in the setting of a cardiac arrest secondary to torsade du pointes requiring defibrillation on 03/29/2013.  He was taken emergently to the cardiac catheterization laboratory by me and found  to have a totally occluded proximal circumflex and 85-90% stenosis in his mid RCA. He underwent successful intervention to his circumflex vessel after undergoing thrombectomy which removed significant thrombus. A Xience Xpedition 3.0x15 mm DES stent was inserted which was postdilated 3.3 mm. He was also noted to have a 90% stenosis in his distal circumflex which underwent PTCA and was reduced to less than 30%. There also is mild disease in the OM1 vessel of 50 and 30%.  He left the laboratory with TIMI 3 flow. 2 days later he underwent staged PCI to his RCA.   His circumflex stent was found to be widely patent. He underwent  angiosculpt cutting balloon, and ultimate stenting of the RCA with a 3.25x18 Xience DES stent postdilated to 3.5 mm the 85-90% stenosis reduced to percent. He also underwent PTCA of the acute marginal branch through the stent strut which improved from 95% to less than 40%. He developed a rash which was felt possibly due to Brilinta and had intolerance to lisinopril.  He has been treated with Effient and low-dose aspirin since his cardiac event.   A nuclear perfusion study on 05/09/2013 showed reduced LV function with severe hypokinesis to akinesis in the inferior temporal lateral collateral region consistent with his prior infarction. Due to high grade iliac stenosis, he subsequently underwent peripheral intervention by Dr. Gwenlyn Found to his right common iliac artery on 06/02/2013. He has felt improved and no longer  experiences claudication. He denies any arrhythmias. He denies palpitations.  Amiodarone was weaned and ultimately discontinued without further arrhythmias.  A followup of duplex scan to his lower extremity in January 2015 suggested an open and patent right common iliac stent without evidence for restenosis.  ABIs were normal bilaterally.  A 1.2.  In January 2015, a followup echo Doppler study showed an ejection fraction of 40-45%.  There was mild mitral regurgitation.  The grade 1 diastolic dysfunction without regional wall motion abnormalities.  When I saw him one year ago, he was sleeping poorly, admitted to fatigue, daytime sleepiness, and had frequent awakenings with loud snoring.  He denied any recurrent anginal symptomatology or claudication or change in exercise tolerance.  Due to concerns for obstructive sleep apnea I scheduled him for sleep study which was done on 05/11/2016.  This was notable for moderate sleep apnea with an HI of 24.6; however, dense were severe with supine sleep with an HI of 33.8, and doing rems sleep with an AHI 41.4.  He had moderate oxygen desaturation to 85%.  Split-night study was done with institution of CPAP.  Initial pressure of 7 cm was recommended.  Unfortunately, without ever coming back to the sleep clinic, he had to give back his machine in October 2017 due to noncompliance.  When I  saw him in August 2018.  He was not sleeping well.  He was waking up at least 3-4 times per night and had significant daytime sleepiness.  He denied any recurrent chest pain or palpitations.    He had a  set up of CPAP therapy on June 04, 2017 with a ResMed10 AutoSet unit and was set at 7 cm water pressure.  A download was obtained  from 10/22/2017 through 11/20/2017.  Usage stays was 77%.  However, usage greater than 4 hours was only 37%.  He was only averaging 3 hours and 33 minutes of CPAP use.  At a 7 cm water pressure.  AHI was 8.5.  His time to meet compliance is 11/27/2017.  An  Epworth sleepiness scale score was recalculated in the office today and this was elevated and endorsed at 11, consistent with daytime sleepiness.    I saw him in December 2018.  Unfortunately due to not meeting compliance standards, he had to turn the machine although there was discussion about trying to obtain the machine for minimal cost.  He was scheduled for reinitiation of CPAP therapy with the [facility].  He denies any chest pain.  He denies palpitations.  He continues to be on a baby aspirin and Plavix 75 mg.  He is on carvedilol 12.5 mg twice a day, isosorbide 30 mg daily.  He continues to be on atorvastatin 80 mg with target LDL less than 70.    I last saw him in January 2021 and since his prior evaluation with me in June 2018 he had received a new ResMed machine at the [facility] hospital.  He has been intermittently using this but typically only uses it for approximately 3 to 4 hours at most per night.  He typically goes to bed at 930 and wakes up around 5 AM.  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  He denied associated chest pain,PND ororthopnea.  He denied heart failure symptoms.  He is not had recent laboratory.    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  [facility] and he is not linked to our office.  He continues to be on aspirin and Plavix for DAPT.  He is on isosorbide 30 mg, carvedilol 12.5 mg twice a day for CAD and is without anginal symptoms.  He is on rosuvastatin 20 mg and Zetia 10 mg for hyperlipidemia.  He has not had recent laboratory.  He presents for evaluation.  Past Medical History:  Diagnosis Date  . Acute MI ([place])    2D ECHO, 03/31/2013 - EF 40-45%. normal  . Allergy   . CAD (coronary artery disease), residual RCA stenosis with recurrent angina 03/31/2013  . Cardiac arrest, secondary to torsodes with 4 shocks by EMS  03/31/2013  . CHF (congestive heart failure) ([place])   . GERD  (gastroesophageal reflux disease)   . Heart attack ([place]) 2014  . Heart murmur   . Hyperlipidemia   . Hypertension   . Peripheral vascular disease with claudication ([facility])    90% ostial right common iliac artery stenosis discovered at the time of cardiac catheterization with an abnormal Doppler study  . S/P angioplasty with stent 03/31/13 to RV ostial marginal with DES Xience Xpedition stent 04/01/2013  . Sleep apnea    no cpap     Past Surgical History:  Procedure Laterality Date  . CARDIAC SURGERY    . COLONOSCOPY    . CORONARY STENT PLACEMENT  4/27-28/2014   DES to LCx (STEMI culprit); staged PCI to RCA for post STEMI Angina  . FRACTURE SURGERY     both ankles as a child  . LEFT HEART CATHETERIZATION  WITH CORONARY ANGIOGRAM N/A 03/29/2013   Procedure: LEFT HEART CATHETERIZATION WITH CORONARY ANGIOGRAM;  Surgeon: Troy Sine, MD;  Location: [facility] CATH LAB;  Service: Cardiovascular;  Laterality: N/A;  . LOWER EXTREMITY ANGIOGRAM N/A 06/02/2013   Procedure: LOWER EXTREMITY ANGIOGRAM;  Surgeon: Lorretta Harp, MD;  Location: [facility] CATH LAB;  Service: Cardiovascular;  Laterality: N/A;  . PERCUTANEOUS CORONARY STENT INTERVENTION (PCI-S) Bilateral 03/31/2013   Procedure: PERCUTANEOUS CORONARY STENT INTERVENTION (PCI-S);  Surgeon: Troy Sine, MD;  Location: [facility] CATH LAB;  Service: Cardiovascular;  Laterality: Bilateral;  . PERCUTANEOUS STENT INTERVENTION Right 06/02/2013   Procedure: PERCUTANEOUS STENT INTERVENTION;  Surgeon: Lorretta Harp, MD;  Location: [facility] CATH LAB;  Service: Cardiovascular;  Laterality: Right;  . POLYPECTOMY    . WISDOM TOOTH EXTRACTION      Allergies  Allergen Reactions  . Lisinopril Rash    rash  . Ticagrelor Rash  . Ticagrelor Rash    Current Outpatient Medications  Medication Sig Dispense Refill  . aspirin EC 81 MG tablet Take 81 mg by mouth daily.    . carvedilol (COREG) 12.5 MG tablet Take 1 tablet (12.5 mg total) by mouth 2 (two) times daily with a meal.  180 tablet 3  . clopidogrel (PLAVIX) 75 MG tablet TAKE 1 TABLET BY MOUTH EVERY DAY 90 tablet 3  . ezetimibe (ZETIA) 10 MG tablet TAKE 1 TABLET BY MOUTH EVERY DAY 90 tablet 1  . famotidine (PEPCID) 20 MG tablet TAKE 1 TABLET BY MOUTH EVERY DAY 90 tablet 1  . ibuprofen (ADVIL,MOTRIN) 200 MG tablet Take 400 mg by mouth daily as needed for pain.    . isosorbide mononitrate (IMDUR) 30 MG 24 hr tablet TAKE 1 TABLET BY MOUTH EVERY DAY 90 tablet 1  . loratadine (CLARITIN) 10 MG tablet Take 1 tablet (10 mg total) by mouth daily. 30 tablet 0  . magnesium oxide (MAG-OX) 400 MG tablet TAKE 1 TABLET BY MOUTH TWICE A DAY NEEDS OFFICE VISIT 60 tablet 1  . nitroGLYCERIN (NITROSTAT) 0.4 MG SL tablet Place 1 tablet (0.4 mg total) under the tongue every 5 (five) minutes as needed for chest pain. 25 tablet 1  . rosuvastatin (CRESTOR) 20 MG tablet Take 20 mg by mouth daily.     Current Facility-Administered Medications  Medication Dose Route Frequency Provider Last Rate Last Admin  . 0.9 %  sodium chloride infusion  500 mL Intravenous Once Jackquline Denmark, MD        Socially he is married. One child. He does exercise with walking. There is remote tobacco history having quit in April 2013. He does drink alcohol. He did smoke occasional marijuana.  ROS General: Negative; No fevers, chills, or night sweats; no significant weight loss HEENT: Negative; No changes in vision or hearing, sinus congestion, difficulty swallowing Pulmonary: Negative; No cough, wheezing, shortness of breath, hemoptysis Cardiovascular: See history of present illness No claudication symptoms GI: Negative; No nausea, vomiting, diarrhea, or abdominal pain GU: Negative; No dysuria, hematuria, or difficulty voiding Musculoskeletal: Negative; no myalgias, joint pain, or weakness Hematologic/Oncology: Negative; no easy bruising, bleeding Endocrine: Negative; no heat/cold intolerance; no diabetes Neuro: Negative; no changes in balance,  headaches Skin: Negative; No rashes or skin lesions Psychiatric: Negative; No behavioral problems, depression Sleep: Positive for obstructive sleep apnea with snoring, daytime sleepiness, hypersomnolence; no bruxism, restless legs, hypnogognic hallucinations, no cataplexy Other comprehensive 14 point system review is negative.   PE BP 122/70 (BP Location: Right Arm, Patient Position: Sitting, Cuff Size: Large)  Pulse (!) 51   Ht _0  (1.778 m)   Wt 215 lb (97.5 kg)   BMI 30.85 kg/m    Repeat blood pressure by me 124/70  Wt Readings from Last 3 Encounters:  03/01/21 215 lb (97.5 kg)  12/22/19 219 lb (99.3 kg)  09/05/19 216 lb (98 kg)   General: Alert, oriented, no distress.  Skin: normal turgor, no rashes, warm and dry HEENT: Normocephalic, atraumatic. Pupils equal round and reactive to light; sclera anicteric; extraocular muscles intact;  Nose without nasal septal hypertrophy Mouth/Parynx benign; Mallinpatti scale 3 Neck: No JVD, no carotid bruits; normal carotid upstroke Lungs: clear to ausculatation and percussion; no wheezing or rales Chest wall: without tenderness to palpitation Heart: PMI not displaced, RRR, s1 s2 normal, 1/6 systolic murmur, no diastolic murmur, no rubs, gallops, thrills, or heaves Abdomen: soft, nontender; no hepatosplenomehaly, BS+; abdominal aorta nontender and not dilated by palpation. Back: no CVA tenderness Pulses 2+ Musculoskeletal: full range of motion, normal strength, no joint deformities Extremities: no clubbing cyanosis or edema, Homan's sign negative  Neurologic: grossly nonfocal; Cranial nerves grossly wnl Psychologic: Normal mood and affect  ECG (independently read by me): Sinus bradycardia at 39, old Inferior posterior MI; normal intervals  January 2021 ECG (independently read by me): Sinus Bradycardia at 52; nonspecific T changes  June 2019 ECG (independently read by me): Normal sinus rhythm at 61 bpm.  Early transition.  Early  repolarization changes.  December 2018 ECG (independently read by me): Normal sinus rhythm at 61 bpm.  Mild RV conduction delay.  Early transition consistent with probable posterior MI  April 2017 ECG (independently read by me): Sinus bradycardia at 53 bpm with mild sinus arrhythmia.  Early transition in V2.  Nonspecific T changes.  February 2016 ECG (independently read by me): Normal sinus rhythm at 62 bpm.  In fear Q wave in 3 nondiagnostic and aVF. Early transition  June 2015 ECG: Sinus bradycardia 52 beats per minute.  Early transition, compatible with his posterior wall MI.  One isolated PVC.  QTc interval 411 milliseconds.  PR interval 152 msec  October 2014 ECG: Normal sinus rhythm at 60 beats per minute. Prominent R wave in V2 suggesting posterior wall involvement. No significant ST changes.  LABS:  BMP Latest Ref Rng & Units 03/04/2021 12/22/2019 10/02/2018  Glucose 65 - 99 mg/dL 89 85 94  BUN 6 - 24 mg/dL _1 Creatinine 0.76 - 1.27 mg/dL 1.03 0.90 0.98  BUN/Creat Ratio 9 - _2 8(L)  Sodium 134 - 144 mmol/L 142 141 139  Potassium 3.5 - 5.2 mmol/L 4.4 4.5 4.2  Chloride 96 - 106 mmol/L 104 103 101  CO2 20 - 29 mmol/L _3 Calcium 8.7 - 10.2 mg/dL 9.5 9.3 9.3     Hepatic Function Latest Ref Rng & Units 03/04/2021 12/22/2019 10/02/2018  Total Protein 6.0 - 8.5 g/dL 6.8 6.7 6.6  Albumin 3.8 - 4.9 g/dL 4.5 4.4 4.3  AST 0 - 40 IU/L _4 ALT 0 - 44 IU/L _5 Alk Phosphatase 44 - 121 IU/L 48 57 55  Total Bilirubin 0.0 - 1.2 mg/dL 0.6 0.6 0.7  Bilirubin, Direct <=0.2 mg/dL - - -     CBC Latest Ref Rng & Units 03/04/2021 12/22/2019 05/28/2018  WBC 3.4 - 10.8 x10E3/uL 6.9 7.9 6.0  Hemoglobin 13.0 - 17.7 g/dL 15.9 15.4 14.3  Hematocrit 37.5 - 51.0 % 44.9 45.4 41.7  Platelets 150 -  450 x10E3/uL 137(L) 152 147(L)   Lab Results  Component Value Date   MCV 88 03/04/2021   MCV 90 12/22/2019   MCV 87 05/28/2018    Lab Results  Component Value Date   TSH 1.290  03/04/2021   Lab Results  Component Value Date   HGBA1C 5.4 04/01/2013   Lipid Panel     Component Value Date/Time   CHOL 129 03/04/2021 0925   TRIG 82 03/04/2021 0925   HDL 48 03/04/2021 0925   CHOLHDL 2.7 03/04/2021 0925   CHOLHDL 3.1 03/17/2016 1004   VLDL 25 03/17/2016 1004   LDLCALC 65 03/04/2021 0925     RADIOLOGY: No results found.  IMPRESSION:  1. S/P coronary artery stent placement, LCX, emergently 03/29/13 and staged to RCA   2. OSA (obstructive sleep apnea)   3. Hyperlipidemia with target LDL less than 70   4. PVD (peripheral vascular disease) ([place])   5. S/P insertion of iliac artery stent     ASSESSMENT AND PLAN: Mr. Chapdelaine is a 55 year old African American gentleman who suffered an ST segment elevation myocardial infarction on 03/29/2013 secondary to circumflex occlusion acutely with VT/VF requiring successful defibrillation and PTCA/stenting of his circumflex vessel with subsequent staged intervention to his right coronary artery several days later on 03/31/2013. He has not had any arrhythmia since that time. He also was found to have high-grade right common iliac stenosis and is underwent successful percutaneous coronary intervention for his peripheral vascular disease. Prior to that procedure, a nuclear study did not demonstrate any ischemia and demonstrated inferior to inferolateral scar c/w the circumflex infarction. Of note, ejection fraction on nuclear study was 32%.  A followup echo Doppler study showed an ejection fraction of 40-45% with grade 1 diastolic dysfunction and without definitive wall motion abnormality.  A follow-up duplex scan of his lower extremity continued to suggest patency of his right common iliac stent.  Presently, he has been without recurrent anginal symptomatology he continues to be on DAPT with aspirin/Plavix as well as carvedilol 12.5 mg twice a day and isosorbide 30 mg daily.  He is on rosuvastatin 20 mg and Zetia 10 mg for hyperlipidemia.   Last laboratory in January 2021 showed total cholesterol 152 with LDL cholesterol of 76.  He has not had recent laboratory.  I am recommending a complete set of fasting labs be obtained.  He received a [facility] CPAP machine at the [facility].  He is not linked to our office so I cannot ascertain his data.  He admits to use.  He has a follow-up appointment at the [place] in several months.  I will contact him regarding laboratory and adjustments to his medical regimen will be made if necessary.  Otherwise I will see him in 1 year for reevaluation.   Troy Sine, MD, [facility]  03/06/2021 9:56 PM

## 2021-03-05 LAB — COMPREHENSIVE METABOLIC PANEL
ALT: 22 IU/L (ref 0–44)
AST: 21 IU/L (ref 0–40)
Albumin/Globulin Ratio: 2 (ref 1.2–2.2)
Albumin: 4.5 g/dL (ref 3.8–4.9)
Alkaline Phosphatase: 48 IU/L (ref 44–121)
BUN/Creatinine Ratio: 11 (ref 9–20)
BUN: 11 mg/dL (ref 6–24)
Bilirubin Total: 0.6 mg/dL (ref 0.0–1.2)
CO2: 22 mmol/L (ref 20–29)
Calcium: 9.5 mg/dL (ref 8.7–10.2)
Chloride: 104 mmol/L (ref 96–106)
Creatinine, Ser: 1.03 mg/dL (ref 0.76–1.27)
Globulin, Total: 2.3 g/dL (ref 1.5–4.5)
Glucose: 89 mg/dL (ref 65–99)
Potassium: 4.4 mmol/L (ref 3.5–5.2)
Sodium: 142 mmol/L (ref 134–144)
Total Protein: 6.8 g/dL (ref 6.0–8.5)
eGFR: 87 mL/min/{1.73_m2} (ref >59)

## 2021-03-05 LAB — TSH: TSH: 1.29 u[IU]/mL (ref 0.450–4.500)

## 2021-03-05 LAB — CBC
Hematocrit: 44.9 % (ref 37.5–51.0)
Hemoglobin: 15.9 g/dL (ref 13.0–17.7)
MCH: 31 pg (ref 26.6–33.0)
MCHC: 35.4 g/dL (ref 31.5–35.7)
MCV: 88 fL (ref 79–97)
Platelets: 137 10*3/uL — ABNORMAL LOW (ref 150–450)
RBC: 5.13 x10E6/uL (ref 4.14–5.80)
RDW: 11.8 % (ref 11.6–15.4)
WBC: 6.9 10*3/uL (ref 3.4–10.8)

## 2021-03-05 LAB — LIPID PANEL
Chol/HDL Ratio: 2.7 ratio (ref 0.0–5.0)
Cholesterol, Total: 129 mg/dL (ref 100–199)
HDL: 48 mg/dL (ref >39)
LDL Chol Calc (NIH): 65 mg/dL (ref 0–99)
Triglycerides: 82 mg/dL (ref 0–149)
VLDL Cholesterol Cal: 16 mg/dL (ref 5–40)

## 2021-03-06 ENCOUNTER — Encounter: Payer: Self-pay | Admitting: Cardiovascular Disease

## 2021-03-25 ENCOUNTER — Other Ambulatory Visit: Payer: Self-pay | Admitting: Cardiovascular Disease

## 2021-03-31 ENCOUNTER — Telehealth: Payer: Self-pay | Admitting: Cardiovascular Disease

## 2021-03-31 NOTE — Telephone Encounter (Signed)
Called patient to ensure all questions/concerns addressed. Patient reports he faxed information for Dr. Claiborne Billings this afternoon. All questions/concerns addressed at this time.

## 2021-03-31 NOTE — Telephone Encounter (Signed)
Patient called to say that he will be faxing over paperwork for Dr. Claiborne Billings to fill out

## 2021-04-12 ENCOUNTER — Telehealth: Payer: Self-pay | Admitting: Cardiovascular Disease

## 2021-04-12 NOTE — Telephone Encounter (Signed)
    Braxten Memmer DOB:  10-Feb-1966  MRN:  [id number]   Primary Cardiologist: Shelva Majestic, MD  Chart reviewed as part of pre-operative protocol coverage. Patient was last seen by Dr. Claiborne Billings in 02/2021. He was contacted today for further pre-op evaluation and reported doing well since last visit. No chest pain, shortness of breath, acute CHF symptoms, palpitations, or syncope.  Given past medical history and time since last visit, based on ACC/AHA guidelines, Verdie Barrows would be at acceptable risk for the planned procedure without further cardiovascular testing. Of note, patient reported he was going to have to postpone his procedure because his mom is sick and not doing well.  Spoke with Dr. Claiborne Billings who stated both Aspirin and Plavix can be held for 5 days prior to dental procedure. These should be restarted as soon as able following procedure.   I will route this recommendation to the requesting party via Epic fax function and remove from pre-op pool.  Please call with questions.  Darreld Mclean, PA-C 04/12/2021, 11:43 AM

## 2021-04-12 NOTE — Telephone Encounter (Signed)
   [facility] Pre-operative Risk Assessment    Request for surgical clearance:  1. What type of surgery is being performed?  -Oral Surgery: Aveoplasty & Torus Removal   2. When is this surgery scheduled?  -04/13/21   3. What type of clearance is required (medical clearance vs. Pharmacy clearance to hold med vs. Both)? - Medical Clearance   4. Are there any medications that need to be held prior to surgery and how long?  -Kristen with [employer] states Dr. Lyda Kalata would like to know what we are recommending. She states she sent a clearance request to the office several times last week, but they haven't heard anything back.  5. Practice name and name of physician performing surgery?  -[facility]  Dr. Idelle Jo   6. What is your office phone number?  -[phone] (opt: 2)     7.   What is your office fax number?  -[phone]  8.   Anesthesia type (None, local, MAC, general) ?  -Local   Zara Council 04/12/2021, 11:10 AM  _________________________________________________________________

## 2021-04-25 ENCOUNTER — Other Ambulatory Visit (HOSPITAL_COMMUNITY): Payer: Self-pay | Admitting: Cardiovascular Disease

## 2021-04-25 ENCOUNTER — Ambulatory Visit (HOSPITAL_COMMUNITY)
Admission: RE | Admit: 2021-04-25 | Discharge: 2021-04-25 | Disposition: A | Discharge status: Home / Self Care | Payer: No Typology Code available for payment source | Source: Ambulatory Visit | Attending: Cardiology | Admitting: Cardiology

## 2021-04-25 ENCOUNTER — Ambulatory Visit (HOSPITAL_BASED_OUTPATIENT_CLINIC_OR_DEPARTMENT_OTHER)
Admission: RE | Admit: 2021-04-25 | Discharge: 2021-04-25 | Disposition: A | Discharge status: Home / Self Care | Payer: No Typology Code available for payment source | Source: Ambulatory Visit | Attending: Cardiovascular Disease | Admitting: Cardiovascular Disease

## 2021-04-25 ENCOUNTER — Other Ambulatory Visit: Payer: Self-pay

## 2021-04-25 DIAGNOSIS — Z95828 Presence of other vascular implants and grafts: Secondary | ICD-10-CM

## 2021-04-25 DIAGNOSIS — I739 Peripheral vascular disease, unspecified: Secondary | ICD-10-CM

## 2021-04-26 ENCOUNTER — Other Ambulatory Visit: Payer: Self-pay | Admitting: Cardiovascular Disease

## 2021-05-15 ENCOUNTER — Other Ambulatory Visit: Payer: Self-pay | Admitting: Cardiovascular Disease

## 2021-12-18 ENCOUNTER — Other Ambulatory Visit: Payer: Self-pay | Admitting: Cardiovascular Disease

## 2022-01-03 ENCOUNTER — Telehealth: Payer: Self-pay | Admitting: Cardiovascular Disease

## 2022-01-03 NOTE — Telephone Encounter (Signed)
Threasa Beards is calling from [employer] home requesting Dr. Claiborne Billings sign the death certificate of this patient who passed on Jan 07, 2022.

## 2022-01-03 NOTE — Telephone Encounter (Signed)
Message sent to Dr. Kelly.

## 2022-01-04 NOTE — Telephone Encounter (Signed)
done

## 2022-01-04 DEATH — deceased

## 2022-01-23 ENCOUNTER — Telehealth: Payer: Self-pay | Admitting: Cardiovascular Disease

## 2022-01-23 NOTE — Telephone Encounter (Signed)
01/23/2022  Spouse brought in [employer] form from employer ([employer]) to be completed for a claim spouse had for patient. (Patient deceased) (2022/01/03).  Forms put in [facility] box to be completed.

## 2022-02-06 NOTE — Telephone Encounter (Signed)
Returned call to patient's wife no answer.Left message on personal voice mail Dr.Kelly's nurse is out of office today.I will send message to her. ?

## 2022-02-06 NOTE — Telephone Encounter (Signed)
Patient's spouse is calling to check on status of paperwork. It's been two weeks tomorrow.  ?

## 2022-02-07 NOTE — Telephone Encounter (Signed)
02/07/2022 ? ?Forms received from provider, contact spouse to inform her forms have been faxed and a copy of the forms were mailed to her.  ?

## 2022-02-07 NOTE — Telephone Encounter (Signed)
We are reviewing paperwork, once completed we will get sent off.  ? ?
